# Patient Record
Sex: Female | Born: 1980 | Race: White | Hispanic: No | Marital: Married | Smoking: Never smoker
Health system: Southern US, Community
[De-identification: ages and names within clinical notes are randomized; demographics above are authoritative.]

## PROBLEM LIST (undated history)

## (undated) ENCOUNTER — Inpatient Hospital Stay (HOSPITAL_COMMUNITY): Payer: Self-pay

## (undated) DIAGNOSIS — I499 Cardiac arrhythmia, unspecified: Secondary | ICD-10-CM

## (undated) DIAGNOSIS — I4510 Unspecified right bundle-branch block: Secondary | ICD-10-CM

## (undated) DIAGNOSIS — F329 Major depressive disorder, single episode, unspecified: Secondary | ICD-10-CM

## (undated) DIAGNOSIS — F411 Generalized anxiety disorder: Secondary | ICD-10-CM

## (undated) DIAGNOSIS — I82409 Acute embolism and thrombosis of unspecified deep veins of unspecified lower extremity: Secondary | ICD-10-CM

## (undated) DIAGNOSIS — M5030 Other cervical disc degeneration, unspecified cervical region: Secondary | ICD-10-CM

## (undated) DIAGNOSIS — R9431 Abnormal electrocardiogram [ECG] [EKG]: Secondary | ICD-10-CM

## (undated) DIAGNOSIS — Z86718 Personal history of other venous thrombosis and embolism: Secondary | ICD-10-CM

## (undated) DIAGNOSIS — T4145XA Adverse effect of unspecified anesthetic, initial encounter: Secondary | ICD-10-CM

## (undated) DIAGNOSIS — Z973 Presence of spectacles and contact lenses: Secondary | ICD-10-CM

## (undated) DIAGNOSIS — L659 Nonscarring hair loss, unspecified: Secondary | ICD-10-CM

## (undated) DIAGNOSIS — N9985 Post endometrial ablation syndrome: Secondary | ICD-10-CM

## (undated) DIAGNOSIS — F419 Anxiety disorder, unspecified: Secondary | ICD-10-CM

## (undated) DIAGNOSIS — Z789 Other specified health status: Secondary | ICD-10-CM

## (undated) DIAGNOSIS — G4700 Insomnia, unspecified: Secondary | ICD-10-CM

## (undated) DIAGNOSIS — T8859XA Other complications of anesthesia, initial encounter: Secondary | ICD-10-CM

## (undated) DIAGNOSIS — F32A Depression, unspecified: Secondary | ICD-10-CM

## (undated) DIAGNOSIS — G245 Blepharospasm: Secondary | ICD-10-CM

## (undated) DIAGNOSIS — O321XX0 Maternal care for breech presentation, not applicable or unspecified: Secondary | ICD-10-CM

## (undated) DIAGNOSIS — G43719 Chronic migraine without aura, intractable, without status migrainosus: Secondary | ICD-10-CM

## (undated) HISTORY — PX: TONSILLECTOMY: SUR1361

## (undated) HISTORY — DX: Anxiety disorder, unspecified: F41.9

## (undated) HISTORY — PX: TUBAL LIGATION: SHX77

## (undated) HISTORY — DX: Insomnia, unspecified: G47.00

## (undated) HISTORY — PX: BACK SURGERY: SHX140

## (undated) HISTORY — DX: Acute embolism and thrombosis of unspecified deep veins of unspecified lower extremity: I82.409

## (undated) HISTORY — DX: Other cervical disc degeneration, unspecified cervical region: M50.30

---
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
## 2002-09-26 ENCOUNTER — Encounter: Payer: Self-pay | Admitting: Emergency Medicine

## 2002-09-26 ENCOUNTER — Inpatient Hospital Stay (HOSPITAL_COMMUNITY): Admission: AC | Admit: 2002-09-26 | Discharge: 2002-09-28 | Payer: Self-pay | Admitting: Emergency Medicine

## 2002-09-27 ENCOUNTER — Encounter: Payer: Self-pay | Admitting: General Surgery

## 2002-10-15 ENCOUNTER — Encounter: Admission: RE | Admit: 2002-10-15 | Discharge: 2002-10-15 | Payer: Self-pay | Admitting: Neurosurgery

## 2002-10-15 ENCOUNTER — Encounter: Payer: Self-pay | Admitting: Neurosurgery

## 2008-10-03 HISTORY — PX: TONSILLECTOMY AND ADENOIDECTOMY: SUR1326

## 2013-02-04 DIAGNOSIS — R61 Generalized hyperhidrosis: Secondary | ICD-10-CM | POA: Insufficient documentation

## 2013-02-04 HISTORY — DX: Generalized hyperhidrosis: R61

## 2013-03-25 DIAGNOSIS — R42 Dizziness and giddiness: Secondary | ICD-10-CM

## 2013-03-25 DIAGNOSIS — L659 Nonscarring hair loss, unspecified: Secondary | ICD-10-CM | POA: Insufficient documentation

## 2013-03-25 DIAGNOSIS — G43909 Migraine, unspecified, not intractable, without status migrainosus: Secondary | ICD-10-CM | POA: Insufficient documentation

## 2013-03-25 DIAGNOSIS — R7989 Other specified abnormal findings of blood chemistry: Secondary | ICD-10-CM

## 2013-03-25 DIAGNOSIS — F32A Depression, unspecified: Secondary | ICD-10-CM | POA: Insufficient documentation

## 2013-03-25 HISTORY — DX: Migraine, unspecified, not intractable, without status migrainosus: G43.909

## 2013-03-25 HISTORY — DX: Other specified abnormal findings of blood chemistry: R79.89

## 2013-03-25 HISTORY — DX: Dizziness and giddiness: R42

## 2013-03-25 HISTORY — DX: Nonscarring hair loss, unspecified: L65.9

## 2014-12-08 DIAGNOSIS — H02402 Unspecified ptosis of left eyelid: Secondary | ICD-10-CM

## 2014-12-08 HISTORY — DX: Unspecified ptosis of left eyelid: H02.402

## 2015-10-06 LAB — OB RESULTS CONSOLE HIV ANTIBODY (ROUTINE TESTING): HIV: NONREACTIVE

## 2015-10-06 LAB — OB RESULTS CONSOLE HEPATITIS B SURFACE ANTIGEN: HEP B S AG: NEGATIVE

## 2015-10-06 LAB — OB RESULTS CONSOLE GC/CHLAMYDIA
CHLAMYDIA, DNA PROBE: NEGATIVE
GC PROBE AMP, GENITAL: NEGATIVE

## 2015-10-06 LAB — OB RESULTS CONSOLE ANTIBODY SCREEN: Antibody Screen: NEGATIVE

## 2015-10-06 LAB — OB RESULTS CONSOLE RPR: RPR: NONREACTIVE

## 2015-11-19 LAB — OB RESULTS CONSOLE ABO/RH: RH TYPE: POSITIVE

## 2015-11-20 LAB — OB RESULTS CONSOLE RUBELLA ANTIBODY, IGM: RUBELLA: IMMUNE

## 2016-03-31 ENCOUNTER — Encounter (HOSPITAL_COMMUNITY): Payer: Self-pay

## 2016-03-31 ENCOUNTER — Inpatient Hospital Stay (HOSPITAL_COMMUNITY)
Admission: AD | Admit: 2016-03-31 | Discharge: 2016-04-01 | Disposition: A | Discharge status: Home / Self Care | Payer: Managed Care, Other (non HMO) | Source: Ambulatory Visit | Attending: Obstetrics and Gynecology | Admitting: Obstetrics and Gynecology

## 2016-03-31 DIAGNOSIS — O479 False labor, unspecified: Secondary | ICD-10-CM

## 2016-03-31 DIAGNOSIS — Z3A29 29 weeks gestation of pregnancy: Secondary | ICD-10-CM | POA: Insufficient documentation

## 2016-03-31 DIAGNOSIS — O4703 False labor before 37 completed weeks of gestation, third trimester: Secondary | ICD-10-CM | POA: Insufficient documentation

## 2016-03-31 HISTORY — DX: Other specified health status: Z78.9

## 2016-03-31 LAB — URINALYSIS, ROUTINE W REFLEX MICROSCOPIC
BILIRUBIN URINE: NEGATIVE
Glucose, UA: NEGATIVE mg/dL
HGB URINE DIPSTICK: NEGATIVE
Ketones, ur: NEGATIVE mg/dL
Leukocytes, UA: NEGATIVE
NITRITE: NEGATIVE
PROTEIN: NEGATIVE mg/dL
pH: 6 (ref 5.0–8.0)

## 2016-03-31 LAB — WET PREP, GENITAL
Clue Cells Wet Prep HPF POC: NONE SEEN
Sperm: NONE SEEN
TRICH WET PREP: NONE SEEN
YEAST WET PREP: NONE SEEN

## 2016-03-31 MED ORDER — LACTATED RINGERS IV BOLUS (SEPSIS)
1000.0000 mL | Freq: Once | INTRAVENOUS | Status: AC
  Administered 2016-03-31: 1000 mL via INTRAVENOUS

## 2016-03-31 MED ORDER — NIFEDIPINE 10 MG PO CAPS
10.0000 mg | ORAL_CAPSULE | ORAL | Status: AC
  Administered 2016-03-31 (×3): 10 mg via ORAL
  Filled 2016-03-31 (×3): qty 1

## 2016-03-31 NOTE — MAU Provider Note (Signed)
History     CSN: [id number]  Arrival date and time: 03/31/16 2157   First Provider Initiated Contact with Patient 03/31/16 2241      Chief Complaint  Patient presents with  . Contractions   HPI Comments: Deborah Petty is a 35 y.o. G2P0010 at [redacted]w[redacted]d who presents today with contractions. She states that they started around 2000. She states that they have been about 2-5 min apart at home. She rates her pain 6/10. She denies any VB or LOF. She states that about 4 weeks ago she had bleeding, but has not had any since. She states that her cervix was closed at that time. She states that the fetus has not been as active today. She has been feeling normal since being here. She reports that she has had intercourse in the last 24 hours.   Past Medical History  Diagnosis Date  . Medical history non-contributory     Past Surgical History  Procedure Laterality Date  . Tonsillectomy    . Back surgery      History reviewed. No pertinent family history.  Social History  Substance Use Topics  . Smoking status: Never Smoker   . Smokeless tobacco: Never Used  . Alcohol Use: Yes     Comment: socially    Allergies: No Known Allergies  Prescriptions prior to admission  Medication Sig Dispense Refill Last Dose  . docusate sodium (COLACE) 100 MG capsule Take 100 mg by mouth daily.   03/31/2016 at Unknown time  . Prenatal Vit-Fe Fumarate-FA (PRENATAL MULTIVITAMIN) TABS tablet Take 1 tablet by mouth at bedtime.   03/30/2016 at Unknown time    Review of Systems  Constitutional: Negative for fever and chills.  Gastrointestinal: Positive for nausea, abdominal pain and diarrhea. Negative for vomiting and constipation.  Genitourinary: Negative for dysuria, urgency and frequency.   Physical Exam   Blood pressure 113/64, pulse 96, temperature 98 F (36.7 C), temperature source Oral, resp. rate 18, height 5\' 6"  (1.676 m), weight 73.936 kg (163 lb), SpO2 99 %.  Physical Exam  Nursing note and vitals  reviewed. Constitutional: She is oriented to person, place, and time. She appears well-developed and well-nourished. No distress.  HENT:  Head: Normocephalic.  Cardiovascular: Normal rate.   Respiratory: Effort normal.  GI: Soft. There is no tenderness.  Genitourinary:   External: no lesion Vagina: small amount of white discharge Cervix: pink, smooth, closed/thick Uterus: AGA   Neurological: She is alert and oriented to person, place, and time.  Skin: Skin is warm and dry.  Psychiatric: She has a normal mood and affect.   FHT: 125, moderate with 15x15 accels, variable deceleration.  Toco: about q 5 min contractions   Results for orders placed or performed during the hospital encounter of 03/31/16 (from the past 24 hour(s))  Urinalysis, Routine w reflex microscopic (not at [facility])     Status: Abnormal   Collection Time: 03/31/16 10:02 PM  Result Value Ref Range   Color, Urine YELLOW YELLOW   APPearance CLEAR CLEAR   Specific Gravity, Urine <1.005 (L) 1.005 - 1.030   pH 6.0 5.0 - 8.0   Glucose, UA NEGATIVE NEGATIVE mg/dL   Hgb urine dipstick NEGATIVE NEGATIVE   Bilirubin Urine NEGATIVE NEGATIVE   Ketones, ur NEGATIVE NEGATIVE mg/dL   Protein, ur NEGATIVE NEGATIVE mg/dL   Nitrite NEGATIVE NEGATIVE   Leukocytes, UA NEGATIVE NEGATIVE  Wet prep, genital     Status: Abnormal   Collection Time: 03/31/16 10:52 PM  Result Value  Ref Range   Yeast Wet Prep HPF POC NONE SEEN NONE SEEN   Trich, Wet Prep NONE SEEN NONE SEEN   Clue Cells Wet Prep HPF POC NONE SEEN NONE SEEN   WBC, Wet Prep HPF POC FEW (A) NONE SEEN   Sperm NONE SEEN   ; MAU Course  Procedures  MDM 2359: Patient has had 1L  Of LR and 3 doses of procardia. Contractions have spaced out. About every 10 mins with some UI. Patient reports that pain has decreased. 0004: Cervix unchanged.  0019: D/W Dr. Ronita Hipps, ok for DC home.   Assessment and Plan   1. Braxton Hicks contractions   2. [redacted] weeks gestation of pregnancy     DC home Comfort measures reviewed  3rd Trimester precautions  PTL precautions  Fetal kick counts RX: none  Return to MAU as needed FU with OB as planned  Follow-up Information    Follow up with Lovenia Kim, MD.   Specialty:  Obstetrics and Gynecology   Why:  As scheduled   Contact information:   [place] [address] [phone]         Mathis Bud 03/31/2016, 10:43 PM

## 2016-03-31 NOTE — Progress Notes (Signed)
States to have MAU provider assess patient.

## 2016-03-31 NOTE — MAU Note (Signed)
Pt c/o lower abdominal cramping and back pain that started around 8pm. States in the last hour has had 13 contractions. Denies vag bleeding or LOF. +FM. Has increase in frequency with urination but denies pain or burning.

## 2016-04-01 NOTE — Discharge Instructions (Signed)

## 2016-04-02 ENCOUNTER — Encounter (HOSPITAL_COMMUNITY): Payer: Self-pay | Admitting: *Deleted

## 2016-04-02 ENCOUNTER — Inpatient Hospital Stay (HOSPITAL_COMMUNITY)
Admission: AD | Admit: 2016-04-02 | Discharge: 2016-04-02 | Disposition: A | Discharge status: Home / Self Care | Payer: Managed Care, Other (non HMO) | Source: Ambulatory Visit | Attending: Obstetrics and Gynecology | Admitting: Obstetrics and Gynecology

## 2016-04-02 DIAGNOSIS — Z3A30 30 weeks gestation of pregnancy: Secondary | ICD-10-CM | POA: Insufficient documentation

## 2016-04-02 MED ORDER — BETAMETHASONE SOD PHOS & ACET 6 (3-3) MG/ML IJ SUSP
12.0000 mg | Freq: Once | INTRAMUSCULAR | Status: AC
  Administered 2016-04-02: 12 mg via INTRAMUSCULAR
  Filled 2016-04-02: qty 2

## 2016-04-02 NOTE — MAU Note (Signed)
Here for betamethasone. Was here on Thursday with cramping.  No c/o today  Reviewed hydration and activity

## 2016-04-03 ENCOUNTER — Inpatient Hospital Stay (HOSPITAL_COMMUNITY)
Admission: AD | Admit: 2016-04-03 | Discharge: 2016-04-03 | Disposition: A | Discharge status: Home / Self Care | Payer: Managed Care, Other (non HMO) | Source: Ambulatory Visit | Attending: Obstetrics and Gynecology | Admitting: Obstetrics and Gynecology

## 2016-04-03 DIAGNOSIS — Z3A30 30 weeks gestation of pregnancy: Secondary | ICD-10-CM | POA: Diagnosis not present

## 2016-04-03 MED ORDER — BETAMETHASONE SOD PHOS & ACET 6 (3-3) MG/ML IJ SUSP
12.0000 mg | Freq: Once | INTRAMUSCULAR | Status: AC
  Administered 2016-04-03: 12 mg via INTRAMUSCULAR
  Filled 2016-04-03: qty 2

## 2016-04-03 NOTE — MAU Note (Signed)
Bad indigestion last night.  tums are not working.  Safe med list given to pt and reviewed.  States had contractions during the night, nothing regular, did take one of her pills as instructed, no longer contracting.

## 2016-04-04 ENCOUNTER — Encounter (HOSPITAL_COMMUNITY): Payer: Self-pay

## 2016-04-04 ENCOUNTER — Encounter (HOSPITAL_COMMUNITY): Payer: Self-pay | Admitting: *Deleted

## 2016-04-04 LAB — GC/CHLAMYDIA PROBE AMP (~~LOC~~) NOT AT ARMC
CHLAMYDIA, DNA PROBE: NEGATIVE
NEISSERIA GONORRHEA: NEGATIVE

## 2016-05-23 ENCOUNTER — Inpatient Hospital Stay (HOSPITAL_COMMUNITY)
Admission: AD | Admit: 2016-05-23 | Discharge: 2016-05-23 | Disposition: A | Discharge status: Home / Self Care | Payer: Managed Care, Other (non HMO) | Source: Ambulatory Visit | Attending: Obstetrics | Admitting: Obstetrics

## 2016-05-23 ENCOUNTER — Encounter (HOSPITAL_COMMUNITY): Payer: Self-pay | Admitting: *Deleted

## 2016-05-23 DIAGNOSIS — Z3A37 37 weeks gestation of pregnancy: Secondary | ICD-10-CM | POA: Insufficient documentation

## 2016-05-23 DIAGNOSIS — O471 False labor at or after 37 completed weeks of gestation: Secondary | ICD-10-CM | POA: Diagnosis present

## 2016-05-23 NOTE — MAU Note (Signed)
States she has been contracting all day. Saw Dr. Ronita Hipps in the office earlier. Was not dilating. States he advised her to stay in [place] for awhile and see what happens. States he told her she could eat lightly. Pt states baby is breech.

## 2016-05-23 NOTE — Discharge Instructions (Signed)
Keep your scheduled appt for prenatal care tomorrow. Call Dr. Ronita Hipps with further concerns.

## 2016-05-26 ENCOUNTER — Other Ambulatory Visit: Payer: Self-pay | Admitting: Obstetrics and Gynecology

## 2016-05-27 ENCOUNTER — Telehealth (HOSPITAL_COMMUNITY): Payer: Self-pay | Admitting: *Deleted

## 2016-05-27 ENCOUNTER — Encounter (HOSPITAL_COMMUNITY): Payer: Self-pay

## 2016-05-27 NOTE — Telephone Encounter (Signed)
Preadmission screen  

## 2016-05-30 ENCOUNTER — Encounter (HOSPITAL_COMMUNITY): Payer: Self-pay | Admitting: Anesthesiology

## 2016-05-30 ENCOUNTER — Encounter (HOSPITAL_COMMUNITY)
Admission: RE | Admit: 2016-05-30 | Discharge: 2016-05-30 | Disposition: A | Discharge status: Home / Self Care | Payer: Managed Care, Other (non HMO) | Source: Ambulatory Visit | Attending: Obstetrics and Gynecology | Admitting: Obstetrics and Gynecology

## 2016-05-30 LAB — TYPE AND SCREEN
ABO/RH(D): A POS
ANTIBODY SCREEN: NEGATIVE

## 2016-05-30 LAB — CBC
HEMATOCRIT: 38.4 % (ref 36.0–46.0)
Hemoglobin: 13.5 g/dL (ref 12.0–15.0)
MCH: 29.5 pg (ref 26.0–34.0)
MCHC: 35.2 g/dL (ref 30.0–36.0)
MCV: 84 fL (ref 78.0–100.0)
PLATELETS: 135 10*3/uL — AB (ref 150–400)
RBC: 4.57 MIL/uL (ref 3.87–5.11)
RDW: 13.7 % (ref 11.5–15.5)
WBC: 12 10*3/uL — AB (ref 4.0–10.5)

## 2016-05-30 LAB — ABO/RH: ABO/RH(D): A POS

## 2016-05-30 NOTE — Patient Instructions (Signed)
20 Nil Florek  05/30/2016   Your procedure is scheduled on:  05/31/2016  Enter through the Main Entrance of [facility] at 3:45 PM.  Pick up the phone at the desk and dial [phone].   Call this number if you have problems the morning of surgery: [phone]   Remember:   Do not eat food:After Midnight.  Do not drink clear liquids: 4 Hours before arrival.  Take these medicines the morning of surgery with A SIP OF WATER: na   Do not wear jewelry, make-up or nail polish.  Do not wear lotions, powders, or perfumes. Do not wear deodorant.  Do not shave 48 hours prior to surgery.  Do not bring valuables to the hospital.  [facility] is not   responsible for any belongings or valuables brought to the hospital.  Contacts, dentures or bridgework may not be worn into surgery.  Leave suitcase in the car. After surgery it may be brought to your room.  For patients admitted to the hospital, checkout time is 11:00 AM the day of              discharge.   Patients discharged the day of surgery will not be allowed to drive             home.  Name and phone number of your driver: na  Special Instructions:   N/A   Please read over the following fact sheets that you were given:   Surgical Site Infection Prevention

## 2016-05-31 ENCOUNTER — Inpatient Hospital Stay (HOSPITAL_COMMUNITY)
Admission: RE | Admit: 2016-05-31 | Payer: Managed Care, Other (non HMO) | Source: Ambulatory Visit | Admitting: Obstetrics and Gynecology

## 2016-05-31 LAB — RPR: RPR Ser Ql: NONREACTIVE

## 2016-05-31 SURGERY — Surgical Case
Anesthesia: Spinal

## 2016-05-31 NOTE — Progress Notes (Signed)
Called by L&D RN on 8/28 at 1730 and informed of cancellation of patient's cesarean section by Dr. Darron Doom due to inadequate documentation to support patient's EDD. Patient called office approximately two weeks inquiring about her incorrect LMP. LMP was adjusted per patient history. Newly reported LMP is agreement with multiple ultrasound documented EDDs. Pregnancy complicated by PTL , breech, and cervical dilatation. Patient lives one hour away from hospital. Patient expresses concerns about possible complications including cord prolapse. Explained to patient that this decision was made by Dr. Kennon Rounds and private MD has no recourse. Office will send all OB ultrasounds to Dr. Kennon Rounds for review.

## 2016-06-03 ENCOUNTER — Inpatient Hospital Stay (HOSPITAL_COMMUNITY)
Admission: AD | Admit: 2016-06-03 | Discharge: 2016-06-03 | Disposition: A | Discharge status: Home / Self Care | Payer: Managed Care, Other (non HMO) | Source: Ambulatory Visit | Attending: Obstetrics and Gynecology | Admitting: Obstetrics and Gynecology

## 2016-06-03 ENCOUNTER — Encounter (HOSPITAL_COMMUNITY): Payer: Managed Care, Other (non HMO)

## 2016-06-03 ENCOUNTER — Encounter (HOSPITAL_COMMUNITY): Payer: Self-pay | Admitting: *Deleted

## 2016-06-03 DIAGNOSIS — Z3A40 40 weeks gestation of pregnancy: Secondary | ICD-10-CM | POA: Diagnosis not present

## 2016-06-03 DIAGNOSIS — Z3493 Encounter for supervision of normal pregnancy, unspecified, third trimester: Secondary | ICD-10-CM | POA: Insufficient documentation

## 2016-06-03 NOTE — Discharge Instructions (Signed)
Fetal Movement Counts  Patient Name: __________________________________________________ Patient Due Date: ____________________  Performing a fetal movement count is highly recommended in high-risk pregnancies, but it is good for every pregnant woman to do. Your health care provider may ask you to start counting fetal movements at 28 weeks of the pregnancy. Fetal movements often increase:  · After eating a full meal.  · After physical activity.  · After eating or drinking something sweet or cold.  · At rest.  Pay attention to when you feel the baby is most active. This will help you notice a pattern of your baby's sleep and wake cycles and what factors contribute to an increase in fetal movement. It is important to perform a fetal movement count at the same time each day when your baby is normally most active.   HOW TO COUNT FETAL MOVEMENTS  1. Find a quiet and comfortable area to sit or lie down on your left side. Lying on your left side provides the best blood and oxygen circulation to your baby.  2. Write down the day and time on a sheet of paper or in a journal.  3. Start counting kicks, flutters, swishes, rolls, or jabs in a 2-hour period. You should feel at least 10 movements within 2 hours.  4. If you do not feel 10 movements in 2 hours, wait 2-3 hours and count again. Look for a change in the pattern or not enough counts in 2 hours.  SEEK MEDICAL CARE IF:  · You feel less than 10 counts in 2 hours, tried twice.  · There is no movement in over an hour.  · The pattern is changing or taking longer each day to reach 10 counts in 2 hours.  · You feel the baby is not moving as he or she usually does.  Date: ____________ Movements: ____________ Start time: ____________ Finish time: ____________   Date: ____________ Movements: ____________ Start time: ____________ Finish time: ____________  Date: ____________ Movements: ____________ Start time: ____________ Finish time: ____________  Date: ____________ Movements:  ____________ Start time: ____________ Finish time: ____________  Date: ____________ Movements: ____________ Start time: ____________ Finish time: ____________  Date: ____________ Movements: ____________ Start time: ____________ Finish time: ____________  Date: ____________ Movements: ____________ Start time: ____________ Finish time: ____________  Date: ____________ Movements: ____________ Start time: ____________ Finish time: ____________   Date: ____________ Movements: ____________ Start time: ____________ Finish time: ____________  Date: ____________ Movements: ____________ Start time: ____________ Finish time: ____________  Date: ____________ Movements: ____________ Start time: ____________ Finish time: ____________  Date: ____________ Movements: ____________ Start time: ____________ Finish time: ____________  Date: ____________ Movements: ____________ Start time: ____________ Finish time: ____________  Date: ____________ Movements: ____________ Start time: ____________ Finish time: ____________  Date: ____________ Movements: ____________ Start time: ____________ Finish time: ____________   Date: ____________ Movements: ____________ Start time: ____________ Finish time: ____________  Date: ____________ Movements: ____________ Start time: ____________ Finish time: ____________  Date: ____________ Movements: ____________ Start time: ____________ Finish time: ____________  Date: ____________ Movements: ____________ Start time: ____________ Finish time: ____________  Date: ____________ Movements: ____________ Start time: ____________ Finish time: ____________  Date: ____________ Movements: ____________ Start time: ____________ Finish time: ____________  Date: ____________ Movements: ____________ Start time: ____________ Finish time: ____________   Date: ____________ Movements: ____________ Start time: ____________ Finish time: ____________  Date: ____________ Movements: ____________ Start time: ____________ Finish  time: ____________  Date: ____________ Movements: ____________ Start time: ____________ Finish time: ____________  Date: ____________ Movements: ____________ Start time:   ____________ Finish time: ____________  Date: ____________ Movements: ____________ Start time: ____________ Finish time: ____________  Date: ____________ Movements: ____________ Start time: ____________ Finish time: ____________  Date: ____________ Movements: ____________ Start time: ____________ Finish time: ____________   Date: ____________ Movements: ____________ Start time: ____________ Finish time: ____________  Date: ____________ Movements: ____________ Start time: ____________ Finish time: ____________  Date: ____________ Movements: ____________ Start time: ____________ Finish time: ____________  Date: ____________ Movements: ____________ Start time: ____________ Finish time: ____________  Date: ____________ Movements: ____________ Start time: ____________ Finish time: ____________  Date: ____________ Movements: ____________ Start time: ____________ Finish time: ____________  Date: ____________ Movements: ____________ Start time: ____________ Finish time: ____________   Date: ____________ Movements: ____________ Start time: ____________ Finish time: ____________  Date: ____________ Movements: ____________ Start time: ____________ Finish time: ____________  Date: ____________ Movements: ____________ Start time: ____________ Finish time: ____________  Date: ____________ Movements: ____________ Start time: ____________ Finish time: ____________  Date: ____________ Movements: ____________ Start time: ____________ Finish time: ____________  Date: ____________ Movements: ____________ Start time: ____________ Finish time: ____________  Date: ____________ Movements: ____________ Start time: ____________ Finish time: ____________   Date: ____________ Movements: ____________ Start time: ____________ Finish time: ____________  Date: ____________  Movements: ____________ Start time: ____________ Finish time: ____________  Date: ____________ Movements: ____________ Start time: ____________ Finish time: ____________  Date: ____________ Movements: ____________ Start time: ____________ Finish time: ____________  Date: ____________ Movements: ____________ Start time: ____________ Finish time: ____________  Date: ____________ Movements: ____________ Start time: ____________ Finish time: ____________  Date: ____________ Movements: ____________ Start time: ____________ Finish time: ____________   Date: ____________ Movements: ____________ Start time: ____________ Finish time: ____________  Date: ____________ Movements: ____________ Start time: ____________ Finish time: ____________  Date: ____________ Movements: ____________ Start time: ____________ Finish time: ____________  Date: ____________ Movements: ____________ Start time: ____________ Finish time: ____________  Date: ____________ Movements: ____________ Start time: ____________ Finish time: ____________  Date: ____________ Movements: ____________ Start time: ____________ Finish time: ____________     This information is not intended to replace advice given to you by your health care provider. Make sure you discuss any questions you have with your health care provider.     Document Released: 10/19/2006 Document Revised: 10/10/2014 Document Reviewed: 07/16/2012  Elsevier Interactive Patient Education ©2016 [employer]

## 2016-06-03 NOTE — MAU Note (Signed)
Pt states she felt like she needed to have a BM about 1100 this morning with lots of pelvic pressure.  States she hasn't felt much movement today.  Breech presentation.  Scheduled for C/S on Tues.  Denies vaginal bleeding, ROM, or abnormal discharge.

## 2016-06-07 ENCOUNTER — Inpatient Hospital Stay (HOSPITAL_COMMUNITY): Payer: Managed Care, Other (non HMO) | Admitting: Anesthesiology

## 2016-06-07 ENCOUNTER — Encounter (HOSPITAL_COMMUNITY)
Admission: RE | Disposition: A | Discharge status: Home / Self Care | Payer: Self-pay | Source: Ambulatory Visit | Attending: Obstetrics and Gynecology

## 2016-06-07 ENCOUNTER — Encounter (HOSPITAL_COMMUNITY): Payer: Self-pay

## 2016-06-07 ENCOUNTER — Inpatient Hospital Stay (HOSPITAL_COMMUNITY)
Admission: RE | Admit: 2016-06-07 | Discharge: 2016-06-10 | DRG: 765 | Disposition: A | Discharge status: Home / Self Care | Payer: Managed Care, Other (non HMO) | Source: Ambulatory Visit | Attending: Obstetrics and Gynecology | Admitting: Obstetrics and Gynecology

## 2016-06-07 DIAGNOSIS — Z8249 Family history of ischemic heart disease and other diseases of the circulatory system: Secondary | ICD-10-CM

## 2016-06-07 DIAGNOSIS — D62 Acute posthemorrhagic anemia: Secondary | ICD-10-CM | POA: Diagnosis not present

## 2016-06-07 DIAGNOSIS — O321XX0 Maternal care for breech presentation, not applicable or unspecified: Principal | ICD-10-CM | POA: Diagnosis present

## 2016-06-07 DIAGNOSIS — Z3A40 40 weeks gestation of pregnancy: Secondary | ICD-10-CM | POA: Diagnosis not present

## 2016-06-07 DIAGNOSIS — Z833 Family history of diabetes mellitus: Secondary | ICD-10-CM

## 2016-06-07 DIAGNOSIS — O9081 Anemia of the puerperium: Secondary | ICD-10-CM | POA: Diagnosis not present

## 2016-06-07 HISTORY — DX: Maternal care for breech presentation, not applicable or unspecified: O32.1XX0

## 2016-06-07 LAB — CBC
HCT: 40.1 % (ref 36.0–46.0)
HEMOGLOBIN: 14.4 g/dL (ref 12.0–15.0)
MCH: 30.4 pg (ref 26.0–34.0)
MCHC: 35.9 g/dL (ref 30.0–36.0)
MCV: 84.6 fL (ref 78.0–100.0)
Platelets: 157 10*3/uL (ref 150–400)
RBC: 4.74 MIL/uL (ref 3.87–5.11)
RDW: 13.6 % (ref 11.5–15.5)
WBC: 12.7 10*3/uL — AB (ref 4.0–10.5)

## 2016-06-07 LAB — TYPE AND SCREEN
ABO/RH(D): A POS
ANTIBODY SCREEN: NEGATIVE

## 2016-06-07 LAB — URINALYSIS, ROUTINE W REFLEX MICROSCOPIC
GLUCOSE, UA: NEGATIVE mg/dL
KETONES UR: NEGATIVE mg/dL
Leukocytes, UA: NEGATIVE
Nitrite: NEGATIVE
PH: 6 (ref 5.0–8.0)
Protein, ur: 30 mg/dL — AB

## 2016-06-07 LAB — URINE MICROSCOPIC-ADD ON

## 2016-06-07 SURGERY — Surgical Case
Anesthesia: Spinal

## 2016-06-07 MED ORDER — BUPIVACAINE HCL (PF) 0.25 % IJ SOLN
INTRAMUSCULAR | Status: AC
  Filled 2016-06-07: qty 10

## 2016-06-07 MED ORDER — LACTATED RINGERS IV SOLN
INTRAVENOUS | Status: DC
  Administered 2016-06-07: 125 mL/h via INTRAVENOUS

## 2016-06-07 MED ORDER — ONDANSETRON HCL 4 MG/2ML IJ SOLN
INTRAMUSCULAR | Status: AC
  Filled 2016-06-07: qty 2

## 2016-06-07 MED ORDER — EPHEDRINE SULFATE 50 MG/ML IJ SOLN
INTRAMUSCULAR | Status: DC | PRN
  Administered 2016-06-07 (×2): 10 mg via INTRAVENOUS

## 2016-06-07 MED ORDER — SENNOSIDES-DOCUSATE SODIUM 8.6-50 MG PO TABS
2.0000 | ORAL_TABLET | ORAL | Status: DC
  Administered 2016-06-08 – 2016-06-10 (×3): 2 via ORAL
  Filled 2016-06-07 (×5): qty 2

## 2016-06-07 MED ORDER — SIMETHICONE 80 MG PO CHEW: 80.0000 mg | CHEWABLE_TABLET | ORAL | Status: DC | PRN

## 2016-06-07 MED ORDER — SODIUM CHLORIDE 0.9% FLUSH: 3.0000 mL | INTRAVENOUS | Status: DC | PRN

## 2016-06-07 MED ORDER — PRENATAL MULTIVITAMIN CH
1.0000 | ORAL_TABLET | Freq: Every day | ORAL | Status: DC
  Administered 2016-06-08 – 2016-06-10 (×3): 1 via ORAL
  Filled 2016-06-07 (×3): qty 1

## 2016-06-07 MED ORDER — MENTHOL 3 MG MT LOZG: 1.0000 | LOZENGE | OROMUCOSAL | Status: DC | PRN

## 2016-06-07 MED ORDER — OXYTOCIN 10 UNIT/ML IJ SOLN
INTRAMUSCULAR | Status: AC
  Filled 2016-06-07: qty 4

## 2016-06-07 MED ORDER — SCOPOLAMINE 1 MG/3DAYS TD PT72
1.0000 | MEDICATED_PATCH | Freq: Once | TRANSDERMAL | Status: DC
Start: 2016-06-07 — End: 2016-06-07
  Administered 2016-06-07: 1.5 mg via TRANSDERMAL

## 2016-06-07 MED ORDER — PHENYLEPHRINE 8 MG IN D5W 100 ML (0.08MG/ML) PREMIX OPTIME
INJECTION | INTRAVENOUS | Status: AC
  Filled 2016-06-07: qty 100

## 2016-06-07 MED ORDER — SCOPOLAMINE 1 MG/3DAYS TD PT72
MEDICATED_PATCH | TRANSDERMAL | Status: AC
  Administered 2016-06-07: 1.5 mg via TRANSDERMAL
  Filled 2016-06-07: qty 1

## 2016-06-07 MED ORDER — METHYLERGONOVINE MALEATE 0.2 MG/ML IJ SOLN
0.2000 mg | INTRAMUSCULAR | Status: DC | PRN
Start: 2016-06-07 — End: 2016-06-10

## 2016-06-07 MED ORDER — SIMETHICONE 80 MG PO CHEW
80.0000 mg | CHEWABLE_TABLET | ORAL | Status: DC
  Administered 2016-06-08 – 2016-06-10 (×3): 80 mg via ORAL
  Filled 2016-06-07 (×3): qty 1

## 2016-06-07 MED ORDER — LACTATED RINGERS IV SOLN
INTRAVENOUS | Status: DC
  Administered 2016-06-08: 03:00:00 via INTRAVENOUS

## 2016-06-07 MED ORDER — ONDANSETRON HCL 4 MG/2ML IJ SOLN
4.0000 mg | Freq: Once | INTRAMUSCULAR | Status: AC | PRN
  Administered 2016-06-07: 4 mg via INTRAVENOUS

## 2016-06-07 MED ORDER — FENTANYL CITRATE (PF) 100 MCG/2ML IJ SOLN
INTRAMUSCULAR | Status: AC
  Filled 2016-06-07: qty 2

## 2016-06-07 MED ORDER — DIPHENHYDRAMINE HCL 50 MG/ML IJ SOLN: 12.5000 mg | INTRAMUSCULAR | Status: DC | PRN

## 2016-06-07 MED ORDER — DIBUCAINE 1 % RE OINT: 1.0000 "application " | TOPICAL_OINTMENT | RECTAL | Status: DC | PRN

## 2016-06-07 MED ORDER — MORPHINE SULFATE (PF) 0.5 MG/ML IJ SOLN
INTRAMUSCULAR | Status: DC | PRN
Start: 2016-06-07 — End: 2016-06-07
  Administered 2016-06-07: .2 mg via INTRATHECAL

## 2016-06-07 MED ORDER — TETANUS-DIPHTH-ACELL PERTUSSIS 5-2.5-18.5 LF-MCG/0.5 IM SUSP: 0.5000 mL | Freq: Once | INTRAMUSCULAR | Status: DC

## 2016-06-07 MED ORDER — ZOLPIDEM TARTRATE 5 MG PO TABS: 5.0000 mg | ORAL_TABLET | Freq: Every evening | ORAL | Status: DC | PRN

## 2016-06-07 MED ORDER — NALBUPHINE HCL 10 MG/ML IJ SOLN
5.0000 mg | INTRAMUSCULAR | Status: DC | PRN
  Filled 2016-06-07: qty 1

## 2016-06-07 MED ORDER — OXYTOCIN 10 UNIT/ML IJ SOLN
INTRAMUSCULAR | Status: DC | PRN
  Administered 2016-06-07: 40 [IU] via INTRAVENOUS

## 2016-06-07 MED ORDER — ONDANSETRON HCL 4 MG/2ML IJ SOLN
INTRAMUSCULAR | Status: DC | PRN
  Administered 2016-06-07: 4 mg via INTRAVENOUS

## 2016-06-07 MED ORDER — WITCH HAZEL-GLYCERIN EX PADS: 1.0000 "application " | MEDICATED_PAD | CUTANEOUS | Status: DC | PRN

## 2016-06-07 MED ORDER — METHYLERGONOVINE MALEATE 0.2 MG PO TABS
0.2000 mg | ORAL_TABLET | ORAL | Status: DC | PRN
Start: 2016-06-07 — End: 2016-06-10

## 2016-06-07 MED ORDER — NALBUPHINE HCL 10 MG/ML IJ SOLN
5.0000 mg | INTRAMUSCULAR | Status: DC | PRN
Start: 2016-06-07 — End: 2016-06-10
  Administered 2016-06-07 – 2016-06-08 (×3): 5 mg via SUBCUTANEOUS
  Filled 2016-06-07: qty 1

## 2016-06-07 MED ORDER — BUPIVACAINE IN DEXTROSE 0.75-8.25 % IT SOLN
INTRATHECAL | Status: DC | PRN
  Administered 2016-06-07: 1.7 mg via INTRATHECAL

## 2016-06-07 MED ORDER — EPHEDRINE 5 MG/ML INJ
INTRAVENOUS | Status: AC
  Filled 2016-06-07: qty 10

## 2016-06-07 MED ORDER — IBUPROFEN 600 MG PO TABS
600.0000 mg | ORAL_TABLET | Freq: Four times a day (QID) | ORAL | Status: DC
  Administered 2016-06-08 – 2016-06-10 (×9): 600 mg via ORAL
  Filled 2016-06-07 (×10): qty 1

## 2016-06-07 MED ORDER — NALBUPHINE HCL 10 MG/ML IJ SOLN: 5.0000 mg | Freq: Once | INTRAMUSCULAR | Status: AC | PRN

## 2016-06-07 MED ORDER — SODIUM CHLORIDE 0.9 % IR SOLN
Status: DC | PRN
  Administered 2016-06-07: 1

## 2016-06-07 MED ORDER — ONDANSETRON HCL 4 MG/2ML IJ SOLN
4.0000 mg | Freq: Three times a day (TID) | INTRAMUSCULAR | Status: DC | PRN
  Administered 2016-06-08 – 2016-06-10 (×4): 4 mg via INTRAVENOUS
  Filled 2016-06-07 (×4): qty 2

## 2016-06-07 MED ORDER — OXYTOCIN 40 UNITS IN LACTATED RINGERS INFUSION - SIMPLE MED: 2.5000 [IU]/h | INTRAVENOUS | Status: AC

## 2016-06-07 MED ORDER — BUPIVACAINE HCL (PF) 0.25 % IJ SOLN
INTRAMUSCULAR | Status: DC | PRN
  Administered 2016-06-07: 20 mL

## 2016-06-07 MED ORDER — FENTANYL CITRATE (PF) 100 MCG/2ML IJ SOLN
INTRAMUSCULAR | Status: DC | PRN
  Administered 2016-06-07: 10 ug via INTRATHECAL

## 2016-06-07 MED ORDER — DIPHENHYDRAMINE HCL 25 MG PO CAPS
25.0000 mg | ORAL_CAPSULE | Freq: Four times a day (QID) | ORAL | Status: DC | PRN
  Administered 2016-06-08: 25 mg via ORAL
  Filled 2016-06-07 (×2): qty 1

## 2016-06-07 MED ORDER — KETOROLAC TROMETHAMINE 30 MG/ML IJ SOLN
30.0000 mg | Freq: Four times a day (QID) | INTRAMUSCULAR | Status: AC | PRN
Start: 2016-06-07 — End: 2016-06-08
  Administered 2016-06-07 – 2016-06-08 (×2): 30 mg via INTRAVENOUS
  Filled 2016-06-07 (×2): qty 1

## 2016-06-07 MED ORDER — MEPERIDINE HCL 25 MG/ML IJ SOLN: 6.2500 mg | INTRAMUSCULAR | Status: DC | PRN

## 2016-06-07 MED ORDER — LACTATED RINGERS IV SOLN: INTRAVENOUS | Status: DC

## 2016-06-07 MED ORDER — SIMETHICONE 80 MG PO CHEW
80.0000 mg | CHEWABLE_TABLET | Freq: Three times a day (TID) | ORAL | Status: DC
  Administered 2016-06-07 – 2016-06-10 (×8): 80 mg via ORAL
  Filled 2016-06-07 (×9): qty 1

## 2016-06-07 MED ORDER — OXYCODONE-ACETAMINOPHEN 5-325 MG PO TABS
1.0000 | ORAL_TABLET | ORAL | Status: DC | PRN
  Administered 2016-06-09: 1 via ORAL
  Filled 2016-06-07: qty 1

## 2016-06-07 MED ORDER — COCONUT OIL OIL
1.0000 "application " | TOPICAL_OIL | Status: DC | PRN
  Filled 2016-06-07: qty 120

## 2016-06-07 MED ORDER — LACTATED RINGERS IV SOLN: Freq: Once | INTRAVENOUS | Status: AC

## 2016-06-07 MED ORDER — NALOXONE HCL 0.4 MG/ML IJ SOLN: 0.4000 mg | INTRAMUSCULAR | Status: DC | PRN

## 2016-06-07 MED ORDER — DIPHENHYDRAMINE HCL 25 MG PO CAPS
25.0000 mg | ORAL_CAPSULE | ORAL | Status: DC | PRN
  Administered 2016-06-07: 25 mg via ORAL
  Filled 2016-06-07 (×2): qty 1

## 2016-06-07 MED ORDER — LACTATED RINGERS IV SOLN
INTRAVENOUS | Status: DC | PRN
Start: 2016-06-07 — End: 2016-06-07
  Administered 2016-06-07 (×3): via INTRAVENOUS

## 2016-06-07 MED ORDER — LACTATED RINGERS IV SOLN
Freq: Once | INTRAVENOUS | Status: AC
  Administered 2016-06-07: 1000 mL/h via INTRAVENOUS

## 2016-06-07 MED ORDER — NALOXONE HCL 2 MG/2ML IJ SOSY
1.0000 ug/kg/h | PREFILLED_SYRINGE | INTRAVENOUS | Status: DC | PRN
  Filled 2016-06-07: qty 2

## 2016-06-07 MED ORDER — MORPHINE SULFATE-NACL 0.5-0.9 MG/ML-% IV SOSY
PREFILLED_SYRINGE | INTRAVENOUS | Status: AC
  Filled 2016-06-07: qty 1

## 2016-06-07 MED ORDER — KETOROLAC TROMETHAMINE 30 MG/ML IJ SOLN
INTRAMUSCULAR | Status: AC
  Filled 2016-06-07: qty 1

## 2016-06-07 MED ORDER — FENTANYL CITRATE (PF) 100 MCG/2ML IJ SOLN: 25.0000 ug | INTRAMUSCULAR | Status: DC | PRN

## 2016-06-07 MED ORDER — PHENYLEPHRINE 8 MG IN D5W 100 ML (0.08MG/ML) PREMIX OPTIME
INJECTION | INTRAVENOUS | Status: DC | PRN
  Administered 2016-06-07 (×3): 60 ug/min via INTRAVENOUS

## 2016-06-07 MED ORDER — NALBUPHINE HCL 10 MG/ML IJ SOLN
INTRAMUSCULAR | Status: AC
  Filled 2016-06-07: qty 1

## 2016-06-07 MED ORDER — OXYCODONE-ACETAMINOPHEN 5-325 MG PO TABS: 2.0000 | ORAL_TABLET | ORAL | Status: DC | PRN

## 2016-06-07 MED ORDER — SCOPOLAMINE 1 MG/3DAYS TD PT72: 1.0000 | MEDICATED_PATCH | Freq: Once | TRANSDERMAL | Status: DC

## 2016-06-07 MED ORDER — ACETAMINOPHEN 325 MG PO TABS
650.0000 mg | ORAL_TABLET | ORAL | Status: DC | PRN
  Administered 2016-06-08 – 2016-06-09 (×3): 650 mg via ORAL
  Filled 2016-06-07 (×3): qty 2

## 2016-06-07 MED ORDER — CEFAZOLIN SODIUM-DEXTROSE 2-3 GM-% IV SOLR
INTRAVENOUS | Status: DC | PRN
  Administered 2016-06-07: 2 g via INTRAVENOUS

## 2016-06-07 MED ORDER — ACETAMINOPHEN 500 MG PO TABS
1000.0000 mg | ORAL_TABLET | Freq: Four times a day (QID) | ORAL | Status: AC
  Administered 2016-06-07 – 2016-06-08 (×3): 1000 mg via ORAL
  Filled 2016-06-07 (×4): qty 2

## 2016-06-07 MED ORDER — NALBUPHINE HCL 10 MG/ML IJ SOLN
5.0000 mg | Freq: Once | INTRAMUSCULAR | Status: AC | PRN
  Administered 2016-06-07: 5 mg via INTRAVENOUS

## 2016-06-07 MED ORDER — KETOROLAC TROMETHAMINE 30 MG/ML IJ SOLN
30.0000 mg | Freq: Four times a day (QID) | INTRAMUSCULAR | Status: AC | PRN
Start: 2016-06-07 — End: 2016-06-08
  Administered 2016-06-07: 30 mg via INTRAMUSCULAR

## 2016-06-07 SURGICAL SUPPLY — 34 items
BENZOIN TINCTURE PRP APPL 2/3 (GAUZE/BANDAGES/DRESSINGS) ×3 IMPLANT
CHLORAPREP W/TINT 26ML (MISCELLANEOUS) ×3 IMPLANT
CLAMP CORD UMBIL (MISCELLANEOUS) IMPLANT
CLOTH BEACON ORANGE TIMEOUT ST (SAFETY) ×3 IMPLANT
CONTAINER PREFILL 10% NBF 15ML (MISCELLANEOUS) IMPLANT
DRSG OPSITE POSTOP 4X10 (GAUZE/BANDAGES/DRESSINGS) ×3 IMPLANT
ELECT REM PT RETURN 9FT ADLT (ELECTROSURGICAL) ×3
ELECTRODE REM PT RTRN 9FT ADLT (ELECTROSURGICAL) ×1 IMPLANT
EXTRACTOR VACUUM M CUP 4 TUBE (SUCTIONS) IMPLANT
EXTRACTOR VACUUM M CUP 4' TUBE (SUCTIONS)
GLOVE BIO SURGEON STRL SZ7.5 (GLOVE) ×3 IMPLANT
GLOVE BIOGEL PI IND STRL 7.0 (GLOVE) ×1 IMPLANT
GLOVE BIOGEL PI INDICATOR 7.0 (GLOVE) ×2
GOWN STRL REUS W/TWL LRG LVL3 (GOWN DISPOSABLE) ×6 IMPLANT
KIT ABG SYR 3ML LUER SLIP (SYRINGE) IMPLANT
NEEDLE HYPO 22GX1.5 SAFETY (NEEDLE) ×3 IMPLANT
NEEDLE HYPO 25X5/8 SAFETYGLIDE (NEEDLE) IMPLANT
NEEDLE SPNL 20GX3.5 QUINCKE YW (NEEDLE) IMPLANT
NS IRRIG 1000ML POUR BTL (IV SOLUTION) ×3 IMPLANT
PACK C SECTION WH (CUSTOM PROCEDURE TRAY) ×3 IMPLANT
PENCIL SMOKE EVAC W/HOLSTER (ELECTROSURGICAL) ×3 IMPLANT
SUT MNCRL 0 VIOLET CTX 36 (SUTURE) ×3 IMPLANT
SUT MNCRL AB 3-0 PS2 27 (SUTURE) ×3 IMPLANT
SUT MON AB 2-0 CT1 27 (SUTURE) ×3 IMPLANT
SUT MON AB-0 CT1 36 (SUTURE) ×6 IMPLANT
SUT MONOCRYL 0 CTX 36 (SUTURE) ×6
SUT PLAIN 0 NONE (SUTURE) IMPLANT
SUT PLAIN 2 0 (SUTURE)
SUT PLAIN 2 0 XLH (SUTURE) ×3 IMPLANT
SUT PLAIN ABS 2-0 CT1 27XMFL (SUTURE) IMPLANT
SYR 20CC LL (SYRINGE) IMPLANT
SYR CONTROL 10ML LL (SYRINGE) ×3 IMPLANT
TOWEL OR 17X24 6PK STRL BLUE (TOWEL DISPOSABLE) ×3 IMPLANT
TRAY FOLEY CATH SILVER 14FR (SET/KITS/TRAYS/PACK) ×3 IMPLANT

## 2016-06-07 NOTE — Anesthesia Procedure Notes (Signed)
Spinal  Patient location during procedure: OR Start time: 06/07/2016 1:11 PM Staffing Anesthesiologist: Josephine Igo Performed: anesthesiologist  Preanesthetic Checklist Completed: patient identified, site marked, surgical consent, pre-op evaluation, timeout performed, IV checked, risks and benefits discussed and monitors and equipment checked Spinal Block Patient position: sitting Prep: site prepped and draped and DuraPrep Patient monitoring: heart rate, cardiac monitor, continuous pulse ox and blood pressure Approach: midline Location: L3-4 Injection technique: single-shot Needle Needle type: Pencan  Needle gauge: 24 G Needle length: 9 cm Assessment Sensory level: T4 Additional Notes Patient tolerated procedure well. Adequate sensory level.

## 2016-06-07 NOTE — H&P (Signed)
Clairessa Zirkelbach is a 35 y.o. female presenting for primary csection for Breech at 40 weeks. See previous note for scheduling delay explanation. Pt declined ECV.  OB History    Gravida Para Term Preterm AB Living   2 0 0 0 1 0   SAB TAB Ectopic Multiple Live Births   1 0 0         Past Medical History:  Diagnosis Date  . Medical history non-contributory    Past Surgical History:  Procedure Laterality Date  . BACK SURGERY     lipoma removal  . TONSILLECTOMY     Family History: family history includes Diabetes in her maternal grandmother and paternal grandfather; Heart attack in her maternal grandmother; Heart disease in her father and paternal grandfather; Hyperlipidemia in her father. Social History:  reports that she has never smoked. She has never used smokeless tobacco. She reports that she drinks alcohol. She reports that she does not use drugs.     Maternal Diabetes: No Genetic Screening: Normal Maternal Ultrasounds/Referrals: Normal Fetal Ultrasounds or other Referrals:  None Maternal Substance Abuse:  No Significant Maternal Medications:  None Significant Maternal Lab Results:  None Other Comments:  None  Review of Systems  Constitutional: Negative.   All other systems reviewed and are negative.  Maternal Medical History:  Fetal activity: Perceived fetal activity is normal.   Last perceived fetal movement was within the past hour.    Prenatal complications: Preterm labor.   Prenatal Complications - Diabetes: none.      Last menstrual period 09/01/2015. Maternal Exam:  Uterine Assessment: Contraction strength is mild.  Contraction frequency is rare.   Abdomen: Patient reports no abdominal tenderness. Fetal presentation: breech  Introitus: Normal vulva. Normal vagina.  Ferning test: not done.  Nitrazine test: not done. Amniotic fluid character: not assessed.  Pelvis: adequate for delivery.   Cervix: Cervix evaluated by digital exam.     Physical Exam   Nursing note and vitals reviewed. Constitutional: She is oriented to person, place, and time. She appears well-developed and well-nourished.  HENT:  Head: Normocephalic and atraumatic.  Neck: Normal range of motion. Neck supple.  Cardiovascular: Normal rate and regular rhythm.   Respiratory: Effort normal and breath sounds normal.  GI: Soft. Bowel sounds are normal.  Genitourinary: Vagina normal and uterus normal.  Musculoskeletal: Normal range of motion.  Neurological: She is alert and oriented to person, place, and time. She has normal reflexes.  Skin: Skin is warm and dry.  Psychiatric: She has a normal mood and affect.    Prenatal labs: ABO, Rh: --/--/A POS, A POS (08/28 1055) Antibody: NEG (08/28 1055) Rubella: Immune (02/17 0000) RPR: Non Reactive (08/28 1055)  HBsAg: Negative (01/03 0000)  HIV: Non-reactive (01/03 0000)  GBS:     Assessment/Plan: 40 week IUP Breech presentation Primary csection. Risks vs benefits discussed.  Consent done.   Jala Dundon J 06/07/2016, 6:06 AM

## 2016-06-07 NOTE — Progress Notes (Signed)
Dr. Ronita Hipps made aware of CCUA results / no need to intervene at this time. Continue routine postpartum orders.  Laury Deep, M MSN, CNM 06/07/2016 11:54 PM

## 2016-06-07 NOTE — Transfer of Care (Signed)
Immediate Anesthesia Transfer of Care Note  Patient: Deborah Petty  Procedure(s) Performed: Procedure(s) with comments: PRIMARY CESAREAN SECTION - BREECH PRESENTATION (N/A) - EDD 06/07/2016  Patient Location: PACU  Anesthesia Type:Spinal  Level of Consciousness: awake  Airway & Oxygen Therapy: Patient Spontanous Breathing  Post-op Assessment: Report given to RN  Post vital signs: Reviewed and stable  Last Vitals:  Vitals:   06/07/16 1048  Pulse: 74  Resp: 18  Temp: 36.7 C    Last Pain:  Vitals:   06/07/16 1048  TempSrc: Oral  PainSc: 1       Patients Stated Pain Goal: 2 ([id number])  Complications: No apparent anesthesia complications

## 2016-06-07 NOTE — Op Note (Signed)
Cesarean Section Procedure Note  Indications: malpresentation: frank breech  Pre-operative Diagnosis: 40 week 0 day pregnancy.  Post-operative Diagnosis: same  Surgeon: Lovenia Kim   Assistants: Renato Battles, CNM  Anesthesia: Local anesthesia 0.25.% bupivacaine and Spinal anesthesia  ASA Class: 2  Procedure Details  The patient was seen in the Holding Room. The risks, benefits, complications, treatment options, and expected outcomes were discussed with the patient.  The patient concurred with the proposed plan, giving informed consent. The risks of anesthesia, infection, bleeding and possible injury to other organs discussed. Injury to bowel, bladder, or ureter with possible need for repair discussed. Possible need for transfusion with secondary risks of hepatitis or HIV acquisition discussed. Post operative complications to include but not limited to DVT, PE and Pneumonia noted. The site of surgery properly noted/marked. The patient was taken to Operating Room # 9, identified as Deborah Petty and the procedure verified as C-Section Delivery. A Time Out was held and the above information confirmed.  After induction of anesthesia, the patient was draped and prepped in the usual sterile manner. A Pfannenstiel incision was made and carried down through the subcutaneous tissue to the fascia. Fascial incision was made and extended transversely using Mayo scissors. The fascia was separated from the underlying rectus tissue superiorly and inferiorly. The peritoneum was identified and entered. Peritoneal incision was extended longitudinally. The utero-vesical peritoneal reflection was incised transversely and the bladder flap was bluntly freed from the lower uterine segment. A low transverse uterine incision(Kerr hysterotomy) was made. Delivered from frank breech presentation with standard maneuvers and gentle flexion of the fetal vertex was a  female with Apgar scores of 8 at one minute and 9 at five  minutes. Bulb suctioning gently performed. Neonatal team in attendance.After the umbilical cord was clamped and cut cord blood was obtained for evaluation. The placenta was removed intact and appeared normal. The uterus was curetted with a dry lap pack. 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 The uterine incision was closed with running locked sutures of 0 Monocryl x 2 layers. Hemostasis was observed. Lavage was carried out until clear.The parietal peritoneum was closed with a running 2-0 Monocryl suture. The fascia was then reapproximated with running sutures of 0 Monocryl. The skin was reapproximated with 3-0 monocryl after Cornelius closure with 2-0 plain.  Instrument, sponge, and needle counts were correct prior the abdominal closure and at the conclusion of the case.   Findings: FTLM, posterior placenta  Estimated Blood Loss:  500         Drains: foley                 Specimens: placenta                 Complications:  None; patient tolerated the procedure well.         Disposition: PACU - hemodynamically stable.         Condition: stable  Attending Attestation: I performed the procedure.

## 2016-06-07 NOTE — Anesthesia Preprocedure Evaluation (Signed)
Anesthesia Evaluation  Patient identified by MRN, date of birth, ID band Patient awake    Reviewed: Allergy & Precautions, NPO status , Patient's Chart, lab work & pertinent test results  History of Anesthesia Complications Negative for: history of anesthetic complications  Airway Mallampati: II  TM Distance: >3 FB Neck ROM: Full    Dental no notable dental hx. (+) Dental Advisory Given   Pulmonary neg pulmonary ROS,    Pulmonary exam normal breath sounds clear to auscultation       Cardiovascular negative cardio ROS Normal cardiovascular exam Rhythm:Regular Rate:Normal     Neuro/Psych negative neurological ROS  negative psych ROS   GI/Hepatic negative GI ROS, Neg liver ROS,   Endo/Other  negative endocrine ROS  Renal/GU negative Renal ROS  negative genitourinary   Musculoskeletal negative musculoskeletal ROS (+)   Abdominal   Peds negative pediatric ROS (+)  Hematology negative hematology ROS (+)   Anesthesia Other Findings   Reproductive/Obstetrics (+) Pregnancy breech                             Anesthesia Physical Anesthesia Plan  ASA: II  Anesthesia Plan: Spinal   Post-op Pain Management:    Induction:   Airway Management Planned:   Additional Equipment:   Intra-op Plan:   Post-operative Plan:   Informed Consent: I have reviewed the patients History and Physical, chart, labs and discussed the procedure including the risks, benefits and alternatives for the proposed anesthesia with the patient or authorized representative who has indicated his/her understanding and acceptance.   Dental advisory given  Plan Discussed with: CRNA  Anesthesia Plan Comments:         Anesthesia Quick Evaluation

## 2016-06-07 NOTE — Progress Notes (Signed)
Patient ID: Deborah Petty, female   DOB: Sep 11, 1981, 35 y.o.   MRN: [id number] Patient seen and examined. Consent witnessed and signed. No changes noted. Update completed.

## 2016-06-08 ENCOUNTER — Encounter (HOSPITAL_COMMUNITY): Payer: Self-pay | Admitting: Obstetrics and Gynecology

## 2016-06-08 LAB — CBC
HEMATOCRIT: 30.7 % — AB (ref 36.0–46.0)
HEMOGLOBIN: 10.8 g/dL — AB (ref 12.0–15.0)
MCH: 29.9 pg (ref 26.0–34.0)
MCHC: 35.2 g/dL (ref 30.0–36.0)
MCV: 85 fL (ref 78.0–100.0)
Platelets: 120 10*3/uL — ABNORMAL LOW (ref 150–400)
RBC: 3.61 MIL/uL — AB (ref 3.87–5.11)
RDW: 13.8 % (ref 11.5–15.5)
WBC: 15.1 10*3/uL — ABNORMAL HIGH (ref 4.0–10.5)

## 2016-06-08 LAB — RPR: RPR Ser Ql: NONREACTIVE

## 2016-06-08 MED ORDER — OXYCODONE HCL 5 MG PO TABS
5.0000 mg | ORAL_TABLET | ORAL | Status: DC | PRN
  Administered 2016-06-08 – 2016-06-10 (×7): 5 mg via ORAL
  Filled 2016-06-08 (×7): qty 1

## 2016-06-08 MED ORDER — LACTATED RINGERS IV BOLUS (SEPSIS)
500.0000 mL | Freq: Once | INTRAVENOUS | Status: AC
  Administered 2016-06-08: 500 mL via INTRAVENOUS

## 2016-06-08 MED ORDER — OXYCODONE HCL 5 MG PO TABS
10.0000 mg | ORAL_TABLET | ORAL | Status: DC | PRN
  Administered 2016-06-08 – 2016-06-10 (×2): 10 mg via ORAL
  Filled 2016-06-08 (×2): qty 2

## 2016-06-08 NOTE — Progress Notes (Signed)
MOB was referred for history of depression/anxiety.  Referral is screened out by Clinical Social Worker because none of the following criteria appear to apply and  there are no reports impacting the pregnancy or her transition to the postpartum period. CSW does not deem it clinically necessary to further investigate at this time.  -History of anxiety/depression during this pregnancy, or of post-partum depression.  - Diagnosis of anxiety and/or depression within last 3 years.-  2013/2014 dx with no notations affecting pregnancy in medical record. - History of depression due to pregnancy loss/loss of child or -MOB's symptoms are currently being treated with medication and/or therapy.  Please contact the Clinical Social Worker if needs arise or upon MOB request.     Lane Hacker, MSW Clinical Social Work: System Print production planner for [employer] social worker [phone]

## 2016-06-08 NOTE — Lactation Note (Signed)
This note was copied from a baby's chart. Lactation Consultation Note  Patient Name: Deborah Petty Today's Date: 06/08/2016 Reason for consult: Follow-up assessment Mom states baby has been sleepy today.  Baby is currently sleeping on mom's chest.  Waking techniques done and clothes removed.  Baby placed skin to skin in laid back position. Baby latched well after a few attempts and nursed actively.  Breast massage encouraged to increase milk flow.  Instructed to feed with any cue and to call for assist prn.  Mom also post pumping to stimulate milk supply.  Maternal Data    Feeding Feeding Type: Breast Fed Length of feed: 20 min  LATCH Score/Interventions Latch: Grasps breast easily, tongue down, lips flanged, rhythmical sucking. Intervention(s): Teach feeding cues;Waking techniques  Audible Swallowing: A few with stimulation Intervention(s): Skin to skin;Hand expression;Alternate breast massage  Type of Nipple: Everted at rest and after stimulation  Comfort (Breast/Nipple): Soft / non-tender     Hold (Positioning): Assistance needed to correctly position infant at breast and maintain latch. Intervention(s): Breastfeeding basics reviewed;Support Pillows;Position options;Skin to skin  LATCH Score: 8  Lactation Tools Discussed/Used     Consult Status Consult Status: Follow-up Date: 06/09/16 Follow-up type: In-patient    Ave Filter 06/08/2016, 3:15 PM

## 2016-06-08 NOTE — Progress Notes (Signed)
POSTOPERATIVE DAY # 1 S/P breech malpresentation   S:         Reports feeling ok             Tolerating po intake / no nausea / no vomiting / no flatus / no BM             Bleeding is light             Pain controlled with motrin             Up ad lib / ambulatory/ voiding QS  Newborn breast feeding   O:  VS: BP (!) 88/56 Comment: dawson CNM notified  Pulse 70   Temp 98.1 F (36.7 C)   Resp 18   Ht 5\' 7"  (1.702 m)   Wt 80.3 kg (177 lb)   LMP 09/01/2015 (Within Days)   SpO2 97%   Breastfeeding? Unknown   BMI 27.72 kg/m   Baseline BP low per prenatal records  LABS:               Recent Labs  06/07/16 1035 06/08/16 0535  WBC 12.7* 15.1*  HGB 14.4 10.8*  PLT 157 120*               Bloodtype: --/--/A POS (09/05 1035)  Rubella: Immune (02/17 0000)                                             I&O: Intake/Output      09/05 0701 - 09/06 0700 09/06 0701 - 09/07 0700   I.V. (mL/kg) 2500 (31.1)    Total Intake(mL/kg) 2500 (31.1)    Urine (mL/kg/hr) 2075    Blood 800    Total Output 2875     Net -375                      Physical Exam:             Alert and Oriented X3  Lungs: Clear and unlabored  Heart: regular rate and rhythm / no mumurs  Abdomen: soft, non-tender, non-distended              Fundus: firm, non-tender, Ueven             Dressing intact          Perineum: intact  Lochia: light  Extremities: SCD in place  A:        POD #  S/P CS             P:        Routine postoperative care                   Artelia Laroche CNM, MSN, [facility] 06/08/2016, 9:39 AM

## 2016-06-08 NOTE — Plan of Care (Signed)
Problem: Activity: Goal: Ability to tolerate increased activity will improve Up t;o bathroom tolerating well.  States ambulating much better

## 2016-06-08 NOTE — Lactation Note (Signed)
This note was copied from a baby's chart. Lactation Consultation Note  Patient Name: Deborah Petty Today's Date: 06/08/2016 Reason for consult: Follow-up assessment Baby at 30 hr of life and mom was requesting help with latch to the L breast. Mom has wide spaced small breast with a small areola and large nipple. Colostrum is easily expressed. Baby has a high palate, a noticeable lingual frenulum with a posterior insertion point, a recessed chin, and a thick upper labial frenulum with an insertion point at the bottom of the gum ridge. Baby can lift tongue to roof, extend tongue past gum ridge, and has good lateralization of tongue. There is a line of small white dots along the midline of the palate in the baby's mouth, possibly Epstein pearls. Baby was able to latch easily to the L breast in a cross cradle position. Mom reported "some" pinching when baby first latched that improve after 90 seconds. Reviewed nipple care. Parents are aware of lactation services and support group. They will call as needed.   Maternal Data    Feeding Feeding Type: Breast Fed Length of feed: 12 min  LATCH Score/Interventions Latch: Grasps breast easily, tongue down, lips flanged, rhythmical sucking. Intervention(s): Skin to skin;Teach feeding cues;Waking techniques Intervention(s): Adjust position;Assist with latch  Audible Swallowing: A few with stimulation Intervention(s): Skin to skin;Hand expression Intervention(s): Alternate breast massage  Type of Nipple: Everted at rest and after stimulation  Comfort (Breast/Nipple): Soft / non-tender     Hold (Positioning): Full assist, staff holds infant at breast Intervention(s): Support Pillows;Position options  LATCH Score: 7  Lactation Tools Discussed/Used     Consult Status Consult Status: Follow-up Date: 06/09/16 Follow-up type: In-patient    Denzil Hughes 06/08/2016, 8:25 PM

## 2016-06-08 NOTE — Lactation Note (Addendum)
This note was copied from a baby's chart. Lactation Consultation Note New mom states has difficulty latching to Lt. Breast. Has latched to Rt. Ok. Mom states had a lot of good feedings yesterday, but has been very sleepy and hasn't wanted to BF during the night since 0230. Educated on newborn behavior, feeding habits, sleeping patterns, STS, cluster feeding, supply and demand. Mom has very small breast, 5 finger wide space between breast. Very little breast tissue between breast. Most of tissue are posterior outter breast. Good everted nipple, areola small around nipple. Hand expression taught w/easily expressed colostrum. Mom encouraged to feed baby 8-12 times/24 hours and with feeding cues. Mom shown how to use DEBP & how to disassemble, clean, & reassemble parts. Mom knows to pump q3h for 15-20 min. Referred to Baby and Me Book in Breastfeeding section Pg. 22-23 for position options and Proper latch demonstration. Encouraged comfort during BF so colostrum flows better and mom will enjoy the feeding longer. Taking deep breaths and breast massage during BF. Attempted to wake baby for feeding assistance. Baby sleeping soundly in moms arms. Encouraged to hand express and spoon feed to stimulate to BF as well as STS. Mom has a Medela DEBP. Encouraged to use hospital grade pump for extra stimulation. Mom had several questions, one was pacifier use. [place] brochure given w/resources, support groups and [place] services. RN had given #20 NS. I encouraged mom not to use it until [facility] evaluate. Mom has good everted nipples. RN gave it d/t baby not wanting to latch to Lt. Nipple. LC feels that with positioning assistance baby will latch.  Patient Name: Deborah Petty Today's Date: 06/08/2016 Reason for consult: Initial assessment   Maternal Data Has patient been taught Hand Expression?: Yes Does the patient have breastfeeding experience prior to this delivery?: No  Feeding    LATCH Score/Interventions Latch: Too  sleepy or reluctant, no latch achieved, no sucking elicited.     Type of Nipple: Everted at rest and after stimulation  Comfort (Breast/Nipple): Soft / non-tender     Intervention(s): Breastfeeding basics reviewed;Support Pillows;Position options;Skin to skin     Lactation Tools Discussed/Used Tools: Pump Breast pump type: Double-Electric Breast Pump WIC Program: No Pump Review: Setup, frequency, and cleaning;Milk Storage Initiated by:: Allayne Stack RN IBCLC Date initiated:: 06/08/16   Consult Status Consult Status: Follow-up Date: 06/08/16 Follow-up type: In-patient    Theodoro Kalata 06/08/2016, 6:48 AM

## 2016-06-09 LAB — GLUCOSE, CAPILLARY: Glucose-Capillary: 97 mg/dL (ref 65–99)

## 2016-06-09 MED ORDER — METOCLOPRAMIDE HCL 5 MG/ML IJ SOLN
10.0000 mg | Freq: Once | INTRAMUSCULAR | Status: AC
Start: 2016-06-09 — End: 2016-06-09
  Administered 2016-06-09: 10 mg via INTRAVENOUS
  Filled 2016-06-09: qty 2

## 2016-06-09 NOTE — Progress Notes (Signed)
Patient called out saying she didn't "feel good" so I went in to check on her and she said she was feeling nauseous, dizzy, lightheaded, and short of breath. I took her blood sugar and it was 97, then I took her blood pressure and it was 88/63, and then I gave her zofran. Sydney checked her pulse ox and it was 99%.

## 2016-06-09 NOTE — Progress Notes (Signed)
At [place] was notified of pt not feeling well and low BP.  She stated she thought it was related to the oxycodone and recommended we give only 5mg  of oxy IR and mom continue to hydrate well.

## 2016-06-09 NOTE — Progress Notes (Signed)
Pt is still nauseated and cannot have zofran until [place] notified and order for reglan received and given to pt.  Will continue to monitor.

## 2016-06-09 NOTE — Lactation Note (Signed)
This note was copied from a baby's chart. Lactation Consultation Note  Patient Name: Boy Fatin Medal Today's Date: 06/09/2016 Reason for consult: Follow-up assessment Baby at 51 hr of life and starting dbl photo therapy. Mom reports baby is still having trouble latching to the L breast. Upon entry baby was latched to the R breast doing non nutritive suckling. Mom took baby off the breast and FOB tried to wake baby. Baby was too sleep at this visit to latch. Mom will offer the breast 8+/24hr and f/u bf with manual expression/spoon feeding. Parents are aware of lactation services and support group. They will call as needed.    Maternal Data    Feeding Feeding Type: Breast Fed Length of feed: 0 min  LATCH Score/Interventions Latch: Too sleepy or reluctant, no latch achieved, no sucking elicited.                    Lactation Tools Discussed/Used     Consult Status Consult Status: Follow-up Date: 06/10/16 Follow-up type: In-patient    Denzil Hughes 06/09/2016, 4:46 PM

## 2016-06-09 NOTE — Anesthesia Postprocedure Evaluation (Signed)
Anesthesia Post Note  Patient: Deborah Petty  Procedure(s) Performed: Procedure(s) (LRB): PRIMARY CESAREAN SECTION - BREECH PRESENTATION (N/A)  Patient location during evaluation: PACU Anesthesia Type: Spinal Level of consciousness: oriented and awake and alert Pain management: pain level controlled Vital Signs Assessment: post-procedure vital signs reviewed and stable Respiratory status: spontaneous breathing, respiratory function stable and patient connected to nasal cannula oxygen Cardiovascular status: blood pressure returned to baseline and stable Postop Assessment: no headache, spinal receding, no signs of nausea or vomiting and no backache Anesthetic complications: no              Abdirizak Richison A.

## 2016-06-09 NOTE — Progress Notes (Addendum)
Patient ID: Deborah Petty, female   DOB: 1981/08/10, 35 y.o.   MRN: [id number] POD # 2  Subjective: Pt reports feeling fatigue, sore / Pain controlled with percocet  Tolerating po/ Foley d/c'ed /Voiding without problems/ No n/v/Flatus pos Activity: out of bed and ambulate Bleeding is light  Episode of hypotension overnight and pt c/o dizziness, headache Newborn info:  Information for the patient's newborn:  Deborah, Petty Tanette [id number]  female  / circ to be done per Dr Ronita Hipps / Feeding: breast   Objective: VS: Blood pressure 118/70  pulse 71, temperature 98.3 F (36.8 C), temperature source Oral, resp. rate 19   Intake/Output Summary (Last 24 hours) at 06/09/16 1053 Last data filed at 06/08/16 1821  Gross per 24 hour  Intake              480 ml  Output             3250 ml  Net            -2770 ml      Recent Labs  06/07/16 1035 06/08/16 0535  WBC 12.7* 15.1*  HGB 14.4 10.8*  HCT 40.1 30.7*  PLT 157 120*    Blood type: --/--/A POS (09/05 1035) Rubella: Immune (02/17 0000)    Physical Exam:  General: alert, cooperative and no distress CV: Regular rate and rhythm Resp: clear Abdomen: soft, nontender, normal bowel sounds Incision: well approximated Uterine Fundus: firm, below umbilicus, nontender Lochia: minimal Ext: no edema, redness or tenderness in the calves or thighs    A/P: POD # 2 G2P1011 S/P Primary C/Section d/t breech  ABL Anemia; will add fe supp POD 2 Hypotension improved; urged increased po fluids and small frequent meals.  OOB slowly Doing well Continue routine post op orders   Signed: Darleen Crocker, MSN, [facility] 06/09/2016, 10:53 AM

## 2016-06-10 MED ORDER — POLYSACCHARIDE IRON COMPLEX 150 MG PO CAPS: 150.0000 mg | ORAL_CAPSULE | Freq: Every day | ORAL | Status: DC

## 2016-06-10 MED ORDER — POLYSACCHARIDE IRON COMPLEX 150 MG PO CAPS: 150.0000 mg | ORAL_CAPSULE | Freq: Every day | ORAL | 1 refills | Status: DC

## 2016-06-10 MED ORDER — OXYCODONE HCL 10 MG PO TABS: ORAL_TABLET | ORAL | 0 refills | Status: DC

## 2016-06-10 MED ORDER — IBUPROFEN 600 MG PO TABS: 600.0000 mg | ORAL_TABLET | Freq: Four times a day (QID) | ORAL | 0 refills | Status: DC

## 2016-06-10 MED ORDER — SIMETHICONE 80 MG PO CHEW: 80.0000 mg | CHEWABLE_TABLET | ORAL | 0 refills | Status: DC

## 2016-06-10 MED ORDER — COCONUT OIL OIL: 1.0000 "application " | TOPICAL_OIL | 0 refills | Status: DC | PRN

## 2016-06-10 NOTE — Lactation Note (Addendum)
This note was copied from a baby's chart. Lactation Consultation Note Mom was about to start feeding when Dexter entered rm. Mom stated she needed help. FOB was at [facility] w/5 FR. [place] w/formula taped onto moms breast for supplementing. Assisted baby in football hold. Baby arching screaming pushing away from breast. Baby would latch then only suck on tube. Repositioned latch several times. Baby wouldn't sustain open latch. And cont. To fight BF. Fitted and taught application of [id number] NS to Lt. Breast. Tubing inserted into NS. Baby latched right away to NS. Then FOB pushed some formula into NS.  Moms breast are filling. Lt. Breast (smallest) was filling as well w/small enlarged ducts. Demonstrated breast massage and hand expression. Baby BF well to Lt. Breast w/NS.  Massage Rt. Breast that was filling w/knots. Has hard milk shelf. Taught breast massage. After feeding gave mom DEBP to pump breast. Discussed prior to pumping supplementing w/colostrum. Doesn't have colostrum to supplement with. Encouraged to BF a few minutes to soften breast and get mom's milk first, then can supplement. Supplementing will hopefull stop after milk comes in. Monitoring for adequate milk supply will determine if supplementing needs to be cont.  Stressed importance of post pumping for breast stimulation, and giving colostrum as supplement, then give formula if needed. Encouraged to feel breast before and after BF to assess for transfer of milk from breast.  Mom needs a lot of encouragement. Has uplifted spirit at this time. Has rested well, no tears at this time.  Baby is VERY jaundice on DPT. Off of lights during BF. Baby has a large stool and pee while BF.  Stressed strict I&O and post pumping. Educated on breast filling, engorgement, prevention and management.  Patient Name: [name] Today's Date: 06/10/2016 Reason for consult: Follow-up assessment;Breast/nipple pain;Difficult latch;Infant weight  loss;Hyperbilirubinemia   Maternal Data    Feeding Feeding Type: Breast Fed Length of feed: 20 min  LATCH Score/Interventions Latch: Repeated attempts needed to sustain latch, nipple held in mouth throughout feeding, stimulation needed to elicit sucking reflex. Intervention(s): Skin to skin;Teach feeding cues;Waking techniques Intervention(s): Adjust position;Assist with latch;Breast massage;Breast compression  Audible Swallowing: A few with stimulation Intervention(s): Hand expression;Skin to skin Intervention(s): Alternate breast massage  Type of Nipple: Everted at rest and after stimulation  Comfort (Breast/Nipple): Filling, red/small blisters or bruises, mild/mod discomfort  Problem noted: Mild/Moderate discomfort;Filling Interventions (Filling): Massage;Frequent nursing;Double electric pump Interventions (Mild/moderate discomfort): Hand massage;Hand expression;Post-pump;Breast shields  Hold (Positioning): Full assist, staff holds infant at breast Intervention(s): Support Pillows;Position options;Skin to skin;Breastfeeding basics reviewed  LATCH Score: 5  Lactation Tools Discussed/Used Tools: Nipple Shields;Pump;11F feeding tube / Syringe Nipple shield size: 20;24 Breast pump type: Double-Electric Breast Pump   Consult Status Consult Status: Follow-up Date: 06/10/16 Follow-up type: In-patient    Theodoro Kalata 06/10/2016, 4:51 AM

## 2016-06-10 NOTE — Discharge Summary (Signed)
OB Discharge Summary     Patient Name: Deborah Petty DOB: 12/18/1980 MRN: [id number]  Date of admission: 06/07/2016 Delivering MD: Brien Few   Date of discharge: 06/10/2016  Admitting diagnosis: primary  breech Intrauterine pregnancy: [redacted]w[redacted]d     Secondary diagnosis:  Principal Problem:   Postpartum care following cesarean delivery (9/5) Active Problems:   Breech presentation   Breech birth  Additional problems: none     Discharge diagnosis: Term Pregnancy Delivered and post-op day 3, stable                                                                                                Post partum procedures:none  Augmentation: N/A  Complications: None  Hospital course:  Sceduled C/S   35 y.o. yo G2P1011 at [redacted]w[redacted]d was admitted to the hospital 06/07/2016 for scheduled cesarean section with the following indication:Malpresentation.  Membrane Rupture Time/Date: 1:34 PM ,06/07/2016   Patient delivered a Viable infant.06/07/2016  Details of operation can be found in separate operative note.  Pateint had an uncomplicated postpartum course.  She is ambulating, tolerating a regular diet, passing flatus, and urinating well. Patient is discharged to boarding status in stable condition on  06/10/16          Physical exam Vitals:   06/09/16 1555 06/09/16 1810 06/09/16 2030 06/10/16 0633  BP: 97/68 100/60 104/65 103/63  Pulse: 67 81  70  Resp: 20 18  17   Temp: 98 F (36.7 C) 98 F (36.7 C)  98.5 F (36.9 C)  TempSrc: Oral Oral  Oral  SpO2: 100%     Weight:      Height:       General: alert, cooperative and no distress Lochia: appropriate Uterine Fundus: firm Incision: Healing well with no significant drainage DVT Evaluation: No significant calf/ankle edema. Labs: Lab Results  Component Value Date   WBC 15.1 (H) 06/08/2016   HGB 10.8 (L) 06/08/2016   HCT 30.7 (L) 06/08/2016   MCV 85.0 06/08/2016   PLT 120 (L) 06/08/2016   No flowsheet data found.  Discharge instruction:  per After Visit Summary and "Baby and Me Booklet".  After visit meds:    Medication List    TAKE these medications   acetaminophen 325 MG tablet Commonly known as:  TYLENOL Take 650 mg by mouth every 6 (six) hours as needed for mild pain.   coconut oil Oil Apply 1 application topically as needed.   docusate sodium 100 MG capsule Commonly known as:  COLACE Take 100 mg by mouth daily as needed for mild constipation or moderate constipation.   ibuprofen 600 MG tablet Commonly known as:  ADVIL,MOTRIN Take 1 tablet (600 mg total) by mouth every 6 (six) hours.   iron polysaccharides 150 MG capsule Commonly known as:  NIFEREX Take 1 capsule (150 mg total) by mouth daily.   Oxycodone HCl 10 MG Tabs 1-2 tabs PO q 4-6 hrs PRN   prenatal multivitamin Tabs tablet Take 1 tablet by mouth at bedtime.   ranitidine 150 MG tablet Commonly known as:  ZANTAC Take 150 mg by mouth daily  as needed for heartburn.   simethicone 80 MG chewable tablet Commonly known as:  MYLICON Chew 1 tablet (80 mg total) by mouth daily.       Diet: routine diet  Activity: Advance as tolerated. Pelvic rest for 6 weeks.   Outpatient follow up:6 weeks Follow up Appt:No future appointments. Follow up Visit:No Follow-up on file.  Postpartum contraception: Not Discussed  Newborn Data: Live born female "Chase" Birth Weight: 8 lb 6 oz (3800 g) APGAR: 8, 9  Baby Feeding: Bottle and Breast Disposition:rooming in   06/10/2016 Juliene Pina, CNM

## 2016-06-10 NOTE — Progress Notes (Signed)
Post discharge chart review completed.  

## 2016-06-10 NOTE — Lactation Note (Signed)
This note was copied from a baby's chart. Lactation Consultation Note  Patient Name: Deborah Petty Today's Date: 06/10/2016 Reason for consult: Follow-up assessment;Hyperbilirubinemia;Infant weight loss  Baby 44 hours old. Patient's bedside RN, Rise Paganini, at bedside and had just latched baby to right breast in football position. Baby able to maintain a deep latch with lips flanged, and suckled rhythmically with a few swallows noted. Mom had to continue stimulating baby to keep baby suckling. Mom reports that baby nursed well on left breast--the more difficult to latch--for about 20 minutes, just prior to latching to the right breast. FOB intends to supplement/finger-feed with EBM--which mom has at bedside, while mom uses DEBP. Mom reports that her breasts are starting to fill, and she has tight areas which LC noted with light palpation. Enc mom to massage and pump/hand express to soften, and discussed engorgement prevention/treatment methods--and enc mom to nurse often. Reviewed hand expression and mom reports that she has a personal DEBP.    Plan is for mom to nurse baby with cues, and at least every 3 hours. Enc parents to continue to supplement until mom's milk comes to volume, and discussed ways of knowing baby getting enough at the breast. Enc mom to postpump, and reviewed Baby and Me booklet for EBM storage guidelines and number of diapers to expect by day of life. Parents aware of OP/BFSG and [place] phone line assistance after D/C.    Maternal Data    Feeding Feeding Type: Breast Fed Length of feed: 20 min  LATCH Score/Interventions Latch: Grasps breast easily, tongue down, lips flanged, rhythmical sucking.  Audible Swallowing: A few with stimulation Intervention(s): Hand expression Intervention(s): Skin to skin  Type of Nipple: Everted at rest and after stimulation  Comfort (Breast/Nipple): Filling, red/small blisters or bruises, mild/mod discomfort  Problem noted:  Filling;Mild/Moderate discomfort Interventions (Filling): Massage;Frequent nursing Interventions (Mild/moderate discomfort): Hand massage;Hand expression;Post-pump  Hold (Positioning): Assistance needed to correctly position infant at breast and maintain latch. Intervention(s): Breastfeeding basics reviewed  LATCH Score: 7  Lactation Tools Discussed/Used Tools: Pump   Consult Status Consult Status: Follow-up Date: 06/11/16 Follow-up type: In-patient    Andres Labrum 06/10/2016, 11:09 AM

## 2016-06-10 NOTE — Progress Notes (Signed)
Patient ID: Deborah Petty, female   DOB: 24-Sep-1981, 35 y.o.   MRN: [id number] Subjective: POD# 3 Information for the patient's newborn:  Deborah, Petty Deborah Petty  female   circ completed Baby name: Deborah Petty Remains inpatient - jaundice and poor feeding/phototherapy  Reports feeling dizzy and shaky with ambulation, also persitent nausea, no emesis. Passing gas, no BM yet. Pain controlled w/ PO meds. Bleeding small, decreasing. Tolerating PO. Feeding: breast and bottle, working w/ LC for latch.    Objective:   VS:    Vitals:   06/09/16 1555 06/09/16 1810 06/09/16 2030 06/10/16 0633  BP: 97/68 100/60 104/65 103/63  Pulse: 67 81  70  Resp: 20 18  17   Temp: 98 F (36.7 C) 98 F (36.7 C)  98.5 F (36.9 C)  TempSrc: Oral Oral  Oral  SpO2: 100%     Weight:      Height:        No intake or output data in the 24 hours ending 06/10/16 1107      Recent Labs  06/08/16 0535  WBC 15.1*  HGB 10.8*  HCT 30.7*  PLT 120*     Blood type: --/--/A POS (09/05 1035)  Rubella: Immune (02/17 0000)     Physical Exam:  General: alert, cooperative and no distress CV: Regular rate and rhythm Resp: clear Abdomen: soft, nontender, normal bowel sounds Incision: clean, dry and intact Uterine Fundus: firm, below umbilicus, nontender Lochia: minimal Ext: edema  trace pedal   Assessment/Plan: 35 y.o.   POD# 3. [id number]                  Principal Problem:   Postpartum care following cesarean delivery (9/5) Active Problems:   Breech presentation   Breech birth   Doing well, stable.    Routine post-op care DC to boarding status.  Deborah Petty, CNM, MSN 06/10/2016, 11:07 AM

## 2016-06-10 NOTE — Discharge Instructions (Signed)
Breast Pumping Tips °If you are breastfeeding, there may be times when you cannot feed your baby directly. Returning to work or going on a trip are common examples. Pumping allows you to store breast milk and feed it to your baby later.  °You may not get much milk when you first start to pump. Your breasts should start to make more after a few days. If you pump at the times you usually feed your baby, you may be able to keep making enough milk to feed your baby without also using formula. The more often you pump, the more milk you will produce.  °WHEN SHOULD I PUMP?  °· You can begin to pump soon after delivery. However, some experts recommend waiting about 4 weeks before giving your infant a bottle to make sure breastfeeding is going well.  °· If you plan to return to work, begin pumping a few weeks before. This will help you develop techniques that work best for you. It also lets you build up a supply of breast milk.   °· When you are with your infant, feed on demand and pump after each feeding.   °· When you are away from your infant for several hours, pump for about 15 minutes every 2-3 hours. Pump both breasts at the same time if you can.   °· If your infant has a formula feeding, make sure to pump around the same time.     °· If you drink any alcohol, wait 2 hours before pumping.   °HOW DO I PREPARE TO PUMP? °Your let-down reflex is the natural reaction to stimulation that makes your breast milk flow. It is easier to stimulate this reflex when you are relaxed. Find relaxation techniques that work for you. If you have difficulty with your let-down reflex, try these methods:  °· Smell one of your infant's blankets or an item of clothing.   °· Look at a picture or video of your infant.   °· Sit in a quiet, private space.   °· Massage the breast you plan to pump.   °· Place soothing warmth on the breast.   °· Play relaxing music.   °WHAT ARE SOME GENERAL BREAST PUMPING TIPS? °· Wash your hands before you pump. You  do not need to wash your nipples or breasts. °· There are three ways to pump. °¨ You can use your hand to massage and compress your breast. °¨ You can use a handheld manual pump. °¨ You can use an electric pump.   °· Make sure the suction cup (flange) on the breast pump is the right size. Place the flange directly over the nipple. If it is the wrong size or placed the wrong way, it may be painful and cause nipple damage.   °· If pumping is uncomfortable, apply a small amount of purified or modified lanolin to your nipple and areola. °· If you are using an electric pump, adjust the speed and suction power to be more comfortable. °· If pumping is painful or if you are not getting very much milk, you may need a different type of pump. A lactation consultant can help you determine what type of pump to use.   °· Keep a full water bottle near you at all times. Drinking lots of fluid helps you make more milk.  °· You can store your milk to use later. Pumped breast milk can be stored in a sealable, sterile container or plastic bag. Label all stored breast milk with the date you pumped it. °¨ Milk can stay out at room temperature for up to 8 hours. °¨   You can store your milk in the refrigerator for up to 8 days.  You can store your milk in the freezer for 3 months. Thaw frozen milk using warm water. Do not put it in the microwave.  Do not smoke. Smoking can lower your milk supply and harm your infant. If you need help quitting, ask your health care provider to recommend a program.  [place] A LACTATION CONSULTANT?  You are having trouble pumping.  You are concerned that you are not making enough milk.  You have nipple pain, soreness, or redness.  You want to use birth control. Birth control pills may lower your milk supply. Talk to your health care provider about your options.   This information is not intended to replace advice given to you by your health care provider.  Make sure you discuss any questions you have with your health care provider.   Document Released: 03/09/2010 Document Revised: 09/24/2013 Document Reviewed: 07/12/2013 Elsevier Interactive Patient Education [employer].

## 2016-06-11 ENCOUNTER — Ambulatory Visit: Payer: Self-pay

## 2016-06-11 NOTE — Lactation Note (Signed)
This note was copied from a baby's chart. Lactation Consultation Note  Baby 38 hours old and being rechecked for jaundice.  Currently sleeping. Mother's breasts are filling and she has ice packs on breasts. Baby's weight has increased.  He is being supplemented in addition to breastfeeding. Recommend parents call for next feeding to [facility] to view and update feeding plan. Mother recently pumped approx 8 ml of colostrum. She is post pumping and using SNS when able but at time is supplementing with finger syringe.   Patient Name: Deborah Petty Today's Date: 06/11/2016     Maternal Data    Feeding    LATCH Score/Interventions                      Lactation Tools Discussed/Used     Consult Status      Vivianne Master [facility] 06/11/2016, 9:35 AM

## 2016-06-11 NOTE — Lactation Note (Signed)
This note was copied from a baby's chart. Lactation Consultation Note  Baby 29 hours old.  Bilirubin level has decreased. Mother recently pumped 10 ml. Mother easily expressed drops of transitional milk. Breasts are filling. Baby latched in cross cradle.  Intermittent sucks and swallows observed. Plan is to continue to post pump 5-6 times a day and give baby back what is pumped at next feeding. Give the difference w/ Alimentum based on infant's demand.  Currently 20-30 ml. As breastmilk supply increases she can decrease formula supplementation. Parents will give supplement w/ SNS at breast or finger syringe feed.  As volume increases, if baby is still latching well, can supplement w/ slow flow nipple. Reviewed engorgement care and monitoring voids/stools. Parents are aware of tight labial frenulum, cupped tongue and short posterior frenulum. Offered OP appt.  Parents will call if needed.  Patient Name: Boy Tyra Bendix Today's Date: 06/11/2016 Reason for consult: Follow-up assessment   Maternal Data    Feeding Feeding Type: Breast Fed  LATCH Score/Interventions Latch: Grasps breast easily, tongue down, lips flanged, rhythmical sucking. Intervention(s): Skin to skin Intervention(s): Breast massage;Breast compression  Audible Swallowing: Spontaneous and intermittent Intervention(s): Hand expression;Skin to skin Intervention(s): Skin to skin  Type of Nipple: Everted at rest and after stimulation  Comfort (Breast/Nipple): Filling, red/small blisters or bruises, mild/mod discomfort  Problem noted: Mild/Moderate discomfort Interventions (Filling): Double electric pump Interventions  (Cracked/bleeding/bruising/blister): Expressed breast milk to nipple Interventions (Mild/moderate discomfort): Hand expression;Post-pump  Hold (Positioning): No assistance needed to correctly position infant at breast.  LATCH Score: 9  Lactation Tools Discussed/Used     Consult Status Consult  Status: Complete    Carlye Grippe 06/11/2016, 10:52 AM

## 2017-10-24 DIAGNOSIS — D171 Benign lipomatous neoplasm of skin and subcutaneous tissue of trunk: Secondary | ICD-10-CM

## 2017-10-24 HISTORY — DX: Benign lipomatous neoplasm of skin and subcutaneous tissue of trunk: D17.1

## 2017-11-13 DIAGNOSIS — Z09 Encounter for follow-up examination after completed treatment for conditions other than malignant neoplasm: Secondary | ICD-10-CM | POA: Insufficient documentation

## 2018-08-08 DIAGNOSIS — R202 Paresthesia of skin: Secondary | ICD-10-CM

## 2018-08-08 HISTORY — DX: Paresthesia of skin: R20.2

## 2018-09-18 DIAGNOSIS — G8929 Other chronic pain: Secondary | ICD-10-CM

## 2018-09-18 DIAGNOSIS — M545 Low back pain, unspecified: Secondary | ICD-10-CM

## 2018-09-18 HISTORY — DX: Low back pain, unspecified: M54.50

## 2018-09-18 HISTORY — DX: Other chronic pain: G89.29

## 2019-10-04 HISTORY — DX: Maternal care for unspecified type scar from previous cesarean delivery: O34.219

## 2020-04-20 ENCOUNTER — Other Ambulatory Visit: Payer: Self-pay | Admitting: Obstetrics and Gynecology

## 2020-05-03 DIAGNOSIS — Z86718 Personal history of other venous thrombosis and embolism: Secondary | ICD-10-CM

## 2020-05-03 HISTORY — DX: Personal history of other venous thrombosis and embolism: Z86.718

## 2020-05-04 ENCOUNTER — Encounter (HOSPITAL_COMMUNITY): Payer: Self-pay

## 2020-05-04 NOTE — Patient Instructions (Addendum)
Deborah Petty  05/04/2020   Your procedure is scheduled on:  05/17/2020  Arrive at [place] at Entrance C on [address] at [facility]  and [address]. You are invited to use the FREE valet parking or use the Visitor's parking deck.  Pick up the phone at the desk and dial [phone].  Call this number if you have problems the morning of surgery: [phone]  Remember:   Do not eat food:(After Midnight) Desps de medianoche.  Do not drink clear liquids: (After Midnight) Desps de medianoche.  Take these medicines the morning of surgery with A SIP OF WATER:  May take pepcid   Do not wear jewelry, make-up or nail polish.  Do not wear lotions, powders, or perfumes. Do not wear deodorant.  Do not shave 48 hours prior to surgery.  Do not bring valuables to the hospital.  [facility] is not   responsible for any belongings or valuables brought to the hospital.  Contacts, dentures or bridgework may not be worn into surgery.  Leave suitcase in the car. After surgery it may be brought to your room.  For patients admitted to the hospital, checkout time is 11:00 AM the day of              discharge.      Please read over the following fact sheets that you were given:     Preparing for Surgery

## 2020-05-05 ENCOUNTER — Encounter (HOSPITAL_COMMUNITY): Payer: Self-pay

## 2020-05-15 ENCOUNTER — Other Ambulatory Visit (HOSPITAL_COMMUNITY)
Admission: RE | Admit: 2020-05-15 | Discharge: 2020-05-15 | Disposition: A | Discharge status: Home / Self Care | Payer: Managed Care, Other (non HMO) | Source: Ambulatory Visit | Attending: Obstetrics and Gynecology | Admitting: Obstetrics and Gynecology

## 2020-05-15 ENCOUNTER — Other Ambulatory Visit: Payer: Self-pay

## 2020-05-15 DIAGNOSIS — Z20822 Contact with and (suspected) exposure to covid-19: Secondary | ICD-10-CM | POA: Insufficient documentation

## 2020-05-15 DIAGNOSIS — Z01812 Encounter for preprocedural laboratory examination: Secondary | ICD-10-CM | POA: Insufficient documentation

## 2020-05-15 HISTORY — DX: Other complications of anesthesia, initial encounter: T88.59XA

## 2020-05-15 HISTORY — DX: Depression, unspecified: F32.A

## 2020-05-15 LAB — CBC
HCT: 38.4 % (ref 36.0–46.0)
Hemoglobin: 12.5 g/dL (ref 12.0–15.0)
MCH: 28.5 pg (ref 26.0–34.0)
MCHC: 32.6 g/dL (ref 30.0–36.0)
MCV: 87.7 fL (ref 80.0–100.0)
Platelets: 200 10*3/uL (ref 150–400)
RBC: 4.38 MIL/uL (ref 3.87–5.11)
RDW: 13.8 % (ref 11.5–15.5)
WBC: 13.6 10*3/uL — ABNORMAL HIGH (ref 4.0–10.5)
nRBC: 0 % (ref 0.0–0.2)

## 2020-05-15 LAB — TYPE AND SCREEN
ABO/RH(D): A POS
Antibody Screen: NEGATIVE

## 2020-05-15 LAB — SARS CORONAVIRUS 2 (TAT 6-24 HRS): SARS Coronavirus 2: NEGATIVE

## 2020-05-15 LAB — RPR: RPR Ser Ql: NONREACTIVE

## 2020-05-15 NOTE — MAU Note (Signed)
Asymptomatic. Swab collected. Waiting on lab.

## 2020-05-16 NOTE — Anesthesia Preprocedure Evaluation (Addendum)
Anesthesia Evaluation  Patient identified by MRN, date of birth, ID band Patient awake    Reviewed: Allergy & Precautions, NPO status , Patient's Chart, lab work & pertinent test results  History of Anesthesia Complications Negative for: history of anesthetic complications  Airway Mallampati: II  TM Distance: >3 FB Neck ROM: Full    Dental  (+) Dental Advisory Given   Pulmonary neg pulmonary ROS,    Pulmonary exam normal        Cardiovascular negative cardio ROS Normal cardiovascular exam     Neuro/Psych PSYCHIATRIC DISORDERS Depression negative neurological ROS     GI/Hepatic negative GI ROS, Neg liver ROS,   Endo/Other   Obesity   Renal/GU negative Renal ROS     Musculoskeletal negative musculoskeletal ROS (+)   Abdominal   Peds  Hematology negative hematology ROS (+)  Plt 200k    Anesthesia Other Findings Covid test negative Per patient, she experienced what sounds like a high spinal with procedure in 2017, describing shortness of breath, weakness in her arms, and inability to swallow. States the team did not let her husband into the operating room because of this and she had prolonged recovery after the procedure. There is no documentation describing these events in her records to verify or obtain more details. Her records appear to show a normal anesthetic with no mention of high spinal. Mild bradycardia after spinal to 50's, during which she received some ephedrine, but not requiring treatment with atropine, glycopyrrolate, or other agents. No significant hypotension seen on the record either, patient was not intubated during the procedure and no documentation of supplemental oxygen, normal SaO2 throughout.  Reproductive/Obstetrics (+) Pregnancy                            Anesthesia Physical Anesthesia Plan  ASA: II  Anesthesia Plan: Spinal   Post-op Pain Management:     Induction:   PONV Risk Score and Plan: 2 and Treatment may vary due to age or medical condition, Ondansetron and Scopolamine patch - Pre-op  Airway Management Planned: Natural Airway  Additional Equipment: None  Intra-op Plan:   Post-operative Plan:   Informed Consent: I have reviewed the patients History and Physical, chart, labs and discussed the procedure including the risks, benefits and alternatives for the proposed anesthesia with the patient or authorized representative who has indicated his/her understanding and acceptance.       Plan Discussed with: CRNA and Anesthesiologist  Anesthesia Plan Comments: (Labs reviewed, platelets acceptable. Discussed risks and benefits of spinal, including spinal/epidural hematoma, infection, failed block, high spinal, and PDPH. Patient expressed understanding and wished to proceed. )       Anesthesia Quick Evaluation

## 2020-05-17 ENCOUNTER — Inpatient Hospital Stay (HOSPITAL_COMMUNITY): Payer: Managed Care, Other (non HMO) | Admitting: Anesthesiology

## 2020-05-17 ENCOUNTER — Other Ambulatory Visit: Payer: Self-pay

## 2020-05-17 ENCOUNTER — Inpatient Hospital Stay (HOSPITAL_COMMUNITY)
Admission: RE | Admit: 2020-05-17 | Discharge: 2020-05-19 | DRG: 788 | Disposition: A | Discharge status: Home / Self Care | Payer: Managed Care, Other (non HMO) | Attending: Obstetrics and Gynecology | Admitting: Obstetrics and Gynecology

## 2020-05-17 ENCOUNTER — Encounter (HOSPITAL_COMMUNITY): Payer: Self-pay | Admitting: Obstetrics and Gynecology

## 2020-05-17 ENCOUNTER — Encounter (HOSPITAL_COMMUNITY)
Admission: RE | Disposition: A | Discharge status: Home / Self Care | Payer: Self-pay | Source: Home / Self Care | Attending: Obstetrics and Gynecology

## 2020-05-17 DIAGNOSIS — O99214 Obesity complicating childbirth: Secondary | ICD-10-CM | POA: Diagnosis present

## 2020-05-17 DIAGNOSIS — O34219 Maternal care for unspecified type scar from previous cesarean delivery: Secondary | ICD-10-CM | POA: Diagnosis present

## 2020-05-17 DIAGNOSIS — E669 Obesity, unspecified: Secondary | ICD-10-CM | POA: Diagnosis present

## 2020-05-17 DIAGNOSIS — O34211 Maternal care for low transverse scar from previous cesarean delivery: Secondary | ICD-10-CM | POA: Diagnosis present

## 2020-05-17 DIAGNOSIS — Z3A39 39 weeks gestation of pregnancy: Secondary | ICD-10-CM | POA: Diagnosis not present

## 2020-05-17 DIAGNOSIS — Z98891 History of uterine scar from previous surgery: Secondary | ICD-10-CM

## 2020-05-17 DIAGNOSIS — Z20822 Contact with and (suspected) exposure to covid-19: Secondary | ICD-10-CM | POA: Diagnosis present

## 2020-05-17 HISTORY — PX: TUBAL LIGATION: SHX77

## 2020-05-17 HISTORY — DX: History of uterine scar from previous surgery: Z98.891

## 2020-05-17 SURGERY — Surgical Case
Anesthesia: Spinal

## 2020-05-17 MED ORDER — PRENATAL MULTIVITAMIN CH
1.0000 | ORAL_TABLET | Freq: Every day | ORAL | Status: DC
  Administered 2020-05-18 – 2020-05-19 (×2): 1 via ORAL
  Filled 2020-05-17 (×2): qty 1

## 2020-05-17 MED ORDER — TETANUS-DIPHTH-ACELL PERTUSSIS 5-2.5-18.5 LF-MCG/0.5 IM SUSP: 0.5000 mL | Freq: Once | INTRAMUSCULAR | Status: DC

## 2020-05-17 MED ORDER — KETOROLAC TROMETHAMINE 30 MG/ML IJ SOLN
30.0000 mg | Freq: Four times a day (QID) | INTRAMUSCULAR | Status: AC
  Administered 2020-05-17 – 2020-05-18 (×4): 30 mg via INTRAVENOUS
  Filled 2020-05-17 (×3): qty 1

## 2020-05-17 MED ORDER — OXYCODONE-ACETAMINOPHEN 5-325 MG PO TABS
1.0000 | ORAL_TABLET | ORAL | Status: DC | PRN
  Administered 2020-05-18 – 2020-05-19 (×5): 2 via ORAL
  Filled 2020-05-17 (×5): qty 2

## 2020-05-17 MED ORDER — SIMETHICONE 80 MG PO CHEW: 80.0000 mg | CHEWABLE_TABLET | ORAL | Status: DC | PRN

## 2020-05-17 MED ORDER — OXYCODONE HCL 5 MG PO TABS: 5.0000 mg | ORAL_TABLET | Freq: Once | ORAL | Status: DC | PRN

## 2020-05-17 MED ORDER — SODIUM CHLORIDE 0.9% FLUSH: 3.0000 mL | INTRAVENOUS | Status: DC | PRN

## 2020-05-17 MED ORDER — WITCH HAZEL-GLYCERIN EX PADS: 1.0000 "application " | MEDICATED_PAD | CUTANEOUS | Status: DC | PRN

## 2020-05-17 MED ORDER — METHYLERGONOVINE MALEATE 0.2 MG/ML IJ SOLN: 0.2000 mg | INTRAMUSCULAR | Status: DC | PRN

## 2020-05-17 MED ORDER — ONDANSETRON HCL 4 MG/2ML IJ SOLN
INTRAMUSCULAR | Status: DC | PRN
  Administered 2020-05-17: 4 mg via INTRAVENOUS

## 2020-05-17 MED ORDER — MENTHOL 3 MG MT LOZG: 1.0000 | LOZENGE | OROMUCOSAL | Status: DC | PRN

## 2020-05-17 MED ORDER — OXYCODONE HCL 5 MG/5ML PO SOLN: 5.0000 mg | Freq: Once | ORAL | Status: DC | PRN

## 2020-05-17 MED ORDER — CHLORHEXIDINE GLUCONATE 0.12 % MT SOLN: 15.0000 mL | Freq: Once | OROMUCOSAL | Status: DC

## 2020-05-17 MED ORDER — BUPIVACAINE HCL (PF) 0.25 % IJ SOLN
INTRAMUSCULAR | Status: AC
  Filled 2020-05-17: qty 30

## 2020-05-17 MED ORDER — PHENYLEPHRINE HCL-NACL 20-0.9 MG/250ML-% IV SOLN
INTRAVENOUS | Status: DC | PRN
  Administered 2020-05-17: 60 ug/min via INTRAVENOUS

## 2020-05-17 MED ORDER — ONDANSETRON HCL 4 MG/2ML IJ SOLN: 4.0000 mg | Freq: Three times a day (TID) | INTRAMUSCULAR | Status: DC | PRN

## 2020-05-17 MED ORDER — POVIDONE-IODINE 10 % EX SWAB: 2.0000 "application " | Freq: Once | CUTANEOUS | Status: DC

## 2020-05-17 MED ORDER — DIPHENHYDRAMINE HCL 50 MG/ML IJ SOLN: 12.5000 mg | INTRAMUSCULAR | Status: DC | PRN

## 2020-05-17 MED ORDER — ORAL CARE MOUTH RINSE: 15.0000 mL | Freq: Once | OROMUCOSAL | Status: DC

## 2020-05-17 MED ORDER — ONDANSETRON HCL 4 MG/2ML IJ SOLN
INTRAMUSCULAR | Status: AC
  Filled 2020-05-17: qty 2

## 2020-05-17 MED ORDER — PHENYLEPHRINE HCL-NACL 20-0.9 MG/250ML-% IV SOLN
INTRAVENOUS | Status: AC
  Filled 2020-05-17: qty 250

## 2020-05-17 MED ORDER — NALBUPHINE HCL 10 MG/ML IJ SOLN: 5.0000 mg | Freq: Once | INTRAMUSCULAR | Status: AC | PRN

## 2020-05-17 MED ORDER — DIBUCAINE (PERIANAL) 1 % EX OINT: 1.0000 "application " | TOPICAL_OINTMENT | CUTANEOUS | Status: DC | PRN

## 2020-05-17 MED ORDER — SCOPOLAMINE 1 MG/3DAYS TD PT72
1.0000 | MEDICATED_PATCH | Freq: Once | TRANSDERMAL | Status: DC
  Administered 2020-05-17: 1.5 mg via TRANSDERMAL
  Filled 2020-05-17: qty 1

## 2020-05-17 MED ORDER — KETOROLAC TROMETHAMINE 30 MG/ML IJ SOLN
INTRAMUSCULAR | Status: AC
  Filled 2020-05-17: qty 1

## 2020-05-17 MED ORDER — OXYTOCIN-SODIUM CHLORIDE 30-0.9 UT/500ML-% IV SOLN: 2.5000 [IU]/h | INTRAVENOUS | Status: AC

## 2020-05-17 MED ORDER — LACTATED RINGERS IV SOLN: INTRAVENOUS | Status: DC

## 2020-05-17 MED ORDER — DEXMEDETOMIDINE (PRECEDEX) IN NS 20 MCG/5ML (4 MCG/ML) IV SYRINGE
PREFILLED_SYRINGE | INTRAVENOUS | Status: DC | PRN
  Administered 2020-05-17: 8 ug via INTRAVENOUS

## 2020-05-17 MED ORDER — CEFAZOLIN SODIUM-DEXTROSE 2-4 GM/100ML-% IV SOLN
2.0000 g | INTRAVENOUS | Status: AC
  Administered 2020-05-17: 2 g via INTRAVENOUS

## 2020-05-17 MED ORDER — NALBUPHINE HCL 10 MG/ML IJ SOLN: 5.0000 mg | INTRAMUSCULAR | Status: DC | PRN

## 2020-05-17 MED ORDER — OXYTOCIN-SODIUM CHLORIDE 30-0.9 UT/500ML-% IV SOLN
INTRAVENOUS | Status: DC | PRN
  Administered 2020-05-17: 30 [IU] via INTRAVENOUS

## 2020-05-17 MED ORDER — MEPERIDINE HCL 25 MG/ML IJ SOLN: 6.2500 mg | INTRAMUSCULAR | Status: DC | PRN

## 2020-05-17 MED ORDER — CEFAZOLIN SODIUM-DEXTROSE 2-4 GM/100ML-% IV SOLN
INTRAVENOUS | Status: AC
  Filled 2020-05-17: qty 100

## 2020-05-17 MED ORDER — LACTATED RINGERS IV SOLN
INTRAVENOUS | Status: DC | PRN
Start: 2020-05-17 — End: 2020-05-17

## 2020-05-17 MED ORDER — KETOROLAC TROMETHAMINE 30 MG/ML IJ SOLN: 30.0000 mg | Freq: Four times a day (QID) | INTRAMUSCULAR | Status: AC | PRN

## 2020-05-17 MED ORDER — SOD CITRATE-CITRIC ACID 500-334 MG/5ML PO SOLN: 30.0000 mL | Freq: Once | ORAL | Status: DC

## 2020-05-17 MED ORDER — SIMETHICONE 80 MG PO CHEW
80.0000 mg | CHEWABLE_TABLET | ORAL | Status: DC
  Administered 2020-05-17 – 2020-05-19 (×2): 80 mg via ORAL
  Filled 2020-05-17 (×2): qty 1

## 2020-05-17 MED ORDER — DEXMEDETOMIDINE (PRECEDEX) IN NS 20 MCG/5ML (4 MCG/ML) IV SYRINGE
PREFILLED_SYRINGE | INTRAVENOUS | Status: AC
  Filled 2020-05-17: qty 5

## 2020-05-17 MED ORDER — NALBUPHINE HCL 10 MG/ML IJ SOLN
5.0000 mg | Freq: Once | INTRAMUSCULAR | Status: AC | PRN
  Administered 2020-05-17: 5 mg via SUBCUTANEOUS

## 2020-05-17 MED ORDER — COCONUT OIL OIL: 1.0000 "application " | TOPICAL_OIL | Status: DC | PRN

## 2020-05-17 MED ORDER — MORPHINE SULFATE (PF) 0.5 MG/ML IJ SOLN
INTRAMUSCULAR | Status: AC
  Filled 2020-05-17: qty 10

## 2020-05-17 MED ORDER — FENTANYL CITRATE (PF) 100 MCG/2ML IJ SOLN: 25.0000 ug | INTRAMUSCULAR | Status: DC | PRN

## 2020-05-17 MED ORDER — BUPIVACAINE IN DEXTROSE 0.75-8.25 % IT SOLN
INTRATHECAL | Status: DC | PRN
  Administered 2020-05-17: 1.5 mg via INTRATHECAL

## 2020-05-17 MED ORDER — METHYLERGONOVINE MALEATE 0.2 MG PO TABS: 0.2000 mg | ORAL_TABLET | ORAL | Status: DC | PRN

## 2020-05-17 MED ORDER — DEXAMETHASONE SODIUM PHOSPHATE 10 MG/ML IJ SOLN
INTRAMUSCULAR | Status: AC
  Filled 2020-05-17: qty 1

## 2020-05-17 MED ORDER — FENTANYL CITRATE (PF) 100 MCG/2ML IJ SOLN
INTRAMUSCULAR | Status: AC
  Filled 2020-05-17: qty 2

## 2020-05-17 MED ORDER — NALOXONE HCL 4 MG/10ML IJ SOLN
1.0000 ug/kg/h | INTRAVENOUS | Status: DC | PRN
  Filled 2020-05-17: qty 5

## 2020-05-17 MED ORDER — MORPHINE SULFATE (PF) 0.5 MG/ML IJ SOLN
INTRAMUSCULAR | Status: DC | PRN
  Administered 2020-05-17: .15 mg via INTRATHECAL

## 2020-05-17 MED ORDER — IBUPROFEN 800 MG PO TABS
800.0000 mg | ORAL_TABLET | Freq: Four times a day (QID) | ORAL | Status: DC
  Administered 2020-05-18 – 2020-05-19 (×5): 800 mg via ORAL
  Filled 2020-05-17 (×5): qty 1

## 2020-05-17 MED ORDER — NALBUPHINE HCL 10 MG/ML IJ SOLN
5.0000 mg | INTRAMUSCULAR | Status: DC | PRN
  Administered 2020-05-18 (×2): 5 mg via INTRAVENOUS
  Filled 2020-05-17 (×2): qty 1

## 2020-05-17 MED ORDER — SCOPOLAMINE 1 MG/3DAYS TD PT72: 1.0000 | MEDICATED_PATCH | Freq: Once | TRANSDERMAL | Status: DC

## 2020-05-17 MED ORDER — BUPIVACAINE HCL (PF) 0.25 % IJ SOLN
INTRAMUSCULAR | Status: DC | PRN
  Administered 2020-05-17: 20 mL

## 2020-05-17 MED ORDER — ZOLPIDEM TARTRATE 5 MG PO TABS: 5.0000 mg | ORAL_TABLET | Freq: Every evening | ORAL | Status: DC | PRN

## 2020-05-17 MED ORDER — FAMOTIDINE 20 MG PO TABS: 20.0000 mg | ORAL_TABLET | Freq: Once | ORAL | Status: DC

## 2020-05-17 MED ORDER — SENNOSIDES-DOCUSATE SODIUM 8.6-50 MG PO TABS
2.0000 | ORAL_TABLET | ORAL | Status: DC
  Administered 2020-05-17 – 2020-05-18 (×2): 2 via ORAL
  Filled 2020-05-17 (×2): qty 2

## 2020-05-17 MED ORDER — NALOXONE HCL 0.4 MG/ML IJ SOLN: 0.4000 mg | INTRAMUSCULAR | Status: DC | PRN

## 2020-05-17 MED ORDER — OXYTOCIN-SODIUM CHLORIDE 30-0.9 UT/500ML-% IV SOLN
INTRAVENOUS | Status: AC
  Filled 2020-05-17: qty 500

## 2020-05-17 MED ORDER — PROMETHAZINE HCL 25 MG/ML IJ SOLN: 6.2500 mg | INTRAMUSCULAR | Status: DC | PRN

## 2020-05-17 MED ORDER — DEXAMETHASONE SODIUM PHOSPHATE 4 MG/ML IJ SOLN
INTRAMUSCULAR | Status: DC | PRN
  Administered 2020-05-17: 8 mg via INTRAVENOUS

## 2020-05-17 MED ORDER — DIPHENHYDRAMINE HCL 25 MG PO CAPS: 25.0000 mg | ORAL_CAPSULE | Freq: Four times a day (QID) | ORAL | Status: DC | PRN

## 2020-05-17 MED ORDER — DIPHENHYDRAMINE HCL 25 MG PO CAPS
25.0000 mg | ORAL_CAPSULE | ORAL | Status: DC | PRN
  Administered 2020-05-18: 25 mg via ORAL
  Filled 2020-05-17: qty 1

## 2020-05-17 MED ORDER — NALBUPHINE HCL 10 MG/ML IJ SOLN
INTRAMUSCULAR | Status: AC
  Filled 2020-05-17: qty 1

## 2020-05-17 MED ORDER — FENTANYL CITRATE (PF) 100 MCG/2ML IJ SOLN
INTRAMUSCULAR | Status: DC | PRN
  Administered 2020-05-17: 15 ug via INTRATHECAL

## 2020-05-17 MED ORDER — SIMETHICONE 80 MG PO CHEW
80.0000 mg | CHEWABLE_TABLET | Freq: Three times a day (TID) | ORAL | Status: DC
  Administered 2020-05-17 – 2020-05-19 (×5): 80 mg via ORAL
  Filled 2020-05-17 (×5): qty 1

## 2020-05-17 SURGICAL SUPPLY — 36 items
BENZOIN TINCTURE PRP APPL 2/3 (GAUZE/BANDAGES/DRESSINGS) ×3 IMPLANT
CHLORAPREP W/TINT 26ML (MISCELLANEOUS) ×3 IMPLANT
CLAMP CORD UMBIL (MISCELLANEOUS) IMPLANT
CLOSURE WOUND 1/2 X4 (GAUZE/BANDAGES/DRESSINGS) ×1
CLOTH BEACON ORANGE TIMEOUT ST (SAFETY) ×3 IMPLANT
DRSG OPSITE POSTOP 4X10 (GAUZE/BANDAGES/DRESSINGS) ×3 IMPLANT
ELECT REM PT RETURN 9FT ADLT (ELECTROSURGICAL) ×3
ELECTRODE REM PT RTRN 9FT ADLT (ELECTROSURGICAL) ×1 IMPLANT
EXTRACTOR VACUUM M CUP 4 TUBE (SUCTIONS) IMPLANT
EXTRACTOR VACUUM M CUP 4' TUBE (SUCTIONS)
GLOVE BIO SURGEON STRL SZ7.5 (GLOVE) ×3 IMPLANT
GLOVE BIOGEL PI IND STRL 7.0 (GLOVE) ×1 IMPLANT
GLOVE BIOGEL PI INDICATOR 7.0 (GLOVE) ×2
GOWN STRL REUS W/TWL LRG LVL3 (GOWN DISPOSABLE) ×6 IMPLANT
KIT ABG SYR 3ML LUER SLIP (SYRINGE) IMPLANT
NEEDLE HYPO 22GX1.5 SAFETY (NEEDLE) ×3 IMPLANT
NEEDLE HYPO 25X5/8 SAFETYGLIDE (NEEDLE) IMPLANT
NEEDLE SPNL 20GX3.5 QUINCKE YW (NEEDLE) IMPLANT
NS IRRIG 1000ML POUR BTL (IV SOLUTION) ×3 IMPLANT
PACK C SECTION WH (CUSTOM PROCEDURE TRAY) ×3 IMPLANT
PENCIL SMOKE EVAC W/HOLSTER (ELECTROSURGICAL) ×3 IMPLANT
STRIP CLOSURE SKIN 1/2X4 (GAUZE/BANDAGES/DRESSINGS) ×2 IMPLANT
SUT MNCRL 0 VIOLET CTX 36 (SUTURE) ×2 IMPLANT
SUT MNCRL AB 3-0 PS2 27 (SUTURE) IMPLANT
SUT MON AB 2-0 CT1 27 (SUTURE) ×3 IMPLANT
SUT MON AB-0 CT1 36 (SUTURE) ×6 IMPLANT
SUT MONOCRYL 0 CTX 36 (SUTURE) ×6
SUT PLAIN 0 NONE (SUTURE) IMPLANT
SUT PLAIN 2 0 (SUTURE)
SUT PLAIN 2 0 XLH (SUTURE) ×3 IMPLANT
SUT PLAIN ABS 2-0 CT1 27XMFL (SUTURE) IMPLANT
SYR 20CC LL (SYRINGE) IMPLANT
SYR CONTROL 10ML LL (SYRINGE) ×3 IMPLANT
TOWEL OR 17X24 6PK STRL BLUE (TOWEL DISPOSABLE) ×3 IMPLANT
TRAY FOLEY W/BAG SLVR 14FR LF (SET/KITS/TRAYS/PACK) ×3 IMPLANT
WATER STERILE IRR 1000ML POUR (IV SOLUTION) ×3 IMPLANT

## 2020-05-17 NOTE — Lactation Note (Signed)
This note was copied from a baby's chart. Lactation Consultation Note  Patient Name: Boy Roshanna Woodhead Today's Date: 05/17/2020 Reason for consult: Initial assessment;Mother's request  Telephone call from NP reporting that mom cant get baby latched onto left breast. NP reports mom had a bad delivery last time and milk never came to volume. Mom did both breastfeeding/formula feeding and feeding breastmilk from a bottle for 7 months.  Asked mom if I could assist with latching to left breast. Mom agreed.  LC noticed that mom has somewhat wide spaced breasts and less rounded breasts.  [place] asked mom about adding some pumping, massage, and hand expression to breastfeeding to maximize her milk production.  Mom reports she felt breasts changes during pregnancy. Mom reports that's how she knew she was pregnant that her breasts were very tender.  Assist with latching on left breast in somewhat laid back breastfeeding position.  After many attempts infant finally latched.  He would take about five to 8 quick sucks and Just hold it in his mouth.  This was the pattern until he fell asleep.  Left STS with mom.  Reviewed [facility] Breastfeeding services consultation handout.  Urged mom to call lactation as needed.

## 2020-05-17 NOTE — Op Note (Signed)
Cesarean Section Procedure Note  Indications: previous uterine incision Kerr x one  Elective TL  Pre-operative Diagnosis: 39 week 1 day pregnancy.  Post-operative Diagnosis: same  Surgeon: Lovenia Kim   Assistants: Pamala Hurry, MD  Anesthesia: Local anesthesia 0.25.% bupivacaine and Spinal anesthesia  ASA Class: 2  Procedure Details  The patient was seen in the Holding Room. The risks, benefits, complications, treatment options, and expected outcomes were discussed with the patient.  The patient concurred with the proposed plan, giving informed consent. The risks of anesthesia, infection, bleeding and possible injury to other organs discussed. Injury to bowel, bladder, or ureter with possible need for repair discussed. Possible need for transfusion with secondary risks of hepatitis or HIV acquisition discussed. Post operative complications to include but not limited to DVT, PE and Pneumonia noted. The site of surgery properly noted/marked. The patient was taken to Operating Room # B, identified as Ashtan Griego and the procedure verified as C-Section Delivery. A Time Out was held and the above information confirmed.  After induction of anesthesia, the patient was draped and prepped in the usual sterile manner. A Pfannenstiel incision was made and carried down through the subcutaneous tissue to the fascia. Fascial incision was made and extended transversely using Mayo scissors. The fascia was separated from the underlying rectus tissue superiorly and inferiorly. The peritoneum was identified and entered. Peritoneal incision was extended longitudinally. The utero-vesical peritoneal reflection was incised transversely and the bladder flap was bluntly freed from the lower uterine segment. A low transverse uterine incision(Kerr hysterotomy) was made. Delivered from OA position was a female and handed off to Peds for evaluation. The placenta was removed intact and appeared normal. The uterus was  curetted with a dry lap pack. Good hemostasis was noted.The uterine outline, tubes and ovaries appeared normal. The uterine incision was closed with running locked sutures of 0 Monocryl x 1 layer. Hemostasis was observed. Lavage was carried out until clear. Modified bilateral Pomeroy partial salpingectomy done withour complications. The parietal peritoneum was closed with a running 2-0 Monocryl suture. The fascia was then reapproximated with running sutures of 0 Monocryl. The skin was reapproximated with 3-0 monocryl.  Instrument, sponge, and needle counts were correct prior the abdominal closure and at the conclusion of the case.   Findings: OA, female, post placenta, nl adnexa  Estimated Blood Loss:  300 mL         Drains: foley                 Specimens: placenta and bilateral tubal segments                 Complications:  None; patient tolerated the procedure well.         Disposition: PACU - hemodynamically stable.         Condition: stable  Attending Attestation: I performed the procedure.

## 2020-05-17 NOTE — Transfer of Care (Signed)
Immediate Anesthesia Transfer of Care Note  Patient: Deborah Petty  Procedure(s) Performed: Repeat CESAREAN SECTION (N/A )  Patient Location: PACU  Anesthesia Type:Spinal  Level of Consciousness: awake, alert , oriented and patient cooperative  Airway & Oxygen Therapy: Patient Spontanous Breathing  Post-op Assessment: Report given to RN, Post -op Vital signs reviewed and stable and Patient moving all extremities X 4  Post vital signs: Reviewed and stable  Last Vitals:  Vitals Value Taken Time  BP    Temp    Pulse 87 05/17/20 1305  Resp    SpO2 99 % 05/17/20 1305  Vitals shown include unvalidated device data.  Last Pain:  Vitals:   05/17/20 1044  TempSrc: Oral         Complications: No complications documented.

## 2020-05-17 NOTE — Anesthesia Procedure Notes (Signed)
Spinal  Patient location during procedure: OR Start time: 05/17/2020 11:55 AM End time: 05/17/2020 11:59 AM Staffing Performed: anesthesiologist  Anesthesiologist: Audry Pili, MD Preanesthetic Checklist Completed: patient identified, IV checked, risks and benefits discussed, surgical consent, monitors and equipment checked, pre-op evaluation and timeout performed Spinal Block Patient position: sitting Prep: DuraPrep Patient monitoring: heart rate, cardiac monitor, continuous pulse ox and blood pressure Approach: midline Location: L3-4 Injection technique: single-shot Needle Needle type: Pencan  Needle gauge: 24 G Additional Notes Consent was obtained prior to the procedure with all questions answered and concerns addressed. Risks including, but not limited to, bleeding, infection, nerve damage, paralysis, failed block, inadequate analgesia, allergic reaction, high spinal, itching, and headache were discussed and the patient wished to proceed. Functioning IV was confirmed and monitors were applied. Sterile prep and drape, including hand hygiene, mask, and sterile gloves were used. The patient was positioned and the spine was prepped. The skin was anesthetized with lidocaine. Free flow of clear CSF was obtained prior to injecting local anesthetic into the CSF. The spinal needle aspirated freely following injection. The needle was carefully withdrawn. The patient tolerated the procedure well.   Renold Don, MD

## 2020-05-17 NOTE — H&P (Addendum)
Deborah Petty is a 39 y.o. female presenting for rpt csection. OB History    Gravida  3   Para  1   Term  1   Preterm  0   AB  1   Living  1     SAB  1   TAB  0   Ectopic  0   Multiple  0   Live Births  1          Past Medical History:  Diagnosis Date  . Breech presentation 06/07/2016  . Complication of anesthesia    review chart from last CS.  Level went high couldn't see,   . Depression    post partum meds taken  . Medical history non-contributory   . Postpartum care following cesarean delivery (9/5) 06/07/2016   Past Surgical History:  Procedure Laterality Date  . BACK SURGERY     lipoma removal  . CESAREAN SECTION N/A 06/07/2016   Procedure: PRIMARY CESAREAN SECTION - BREECH PRESENTATION;  Surgeon: Brien Few, MD;  Location: [place];  Service: Obstetrics;  Laterality: N/A;  EDD 06/07/2016  . TONSILLECTOMY     Family History: family history includes Diabetes in her maternal grandmother and paternal grandfather; Heart attack in her maternal grandmother; Heart disease in her father and paternal grandfather; Hyperlipidemia in her father. Social History:  reports that she has never smoked. She has never used smokeless tobacco. She reports current alcohol use. She reports that she does not use drugs.     Maternal Diabetes: No Genetic Screening: Normal Maternal Ultrasounds/Referrals: Normal Fetal Ultrasounds or other Referrals:  None Maternal Substance Abuse:  No Significant Maternal Medications:  None Significant Maternal Lab Results:  Group B Strep negative Other Comments:  None  Review of Systems  Constitutional: Negative.   All other systems reviewed and are negative.  Maternal Medical History:  Reason for admission: Contractions.   Contractions: Onset was more than 2 days ago.   Frequency: irregular.   Perceived severity is mild.    Fetal activity: Perceived fetal activity is normal.   Last perceived fetal movement was within the past  hour.    Prenatal complications: Polyhydramnios.   Prenatal Complications - Diabetes: none.      unknown if currently breastfeeding. Maternal Exam:  Uterine Assessment: Contraction strength is mild.  Contraction frequency is irregular.   Abdomen: Patient reports no abdominal tenderness. Surgical scars: low transverse.   Fetal presentation: vertex  Introitus: Normal vulva. Normal vagina.  Ferning test: not done.  Nitrazine test: not done. Amniotic fluid character: not assessed.  Cervix: Cervix evaluated by digital exam.     Physical Exam Vitals and nursing note reviewed.  Constitutional:      Appearance: Normal appearance.  HENT:     Head: Normocephalic and atraumatic.  Cardiovascular:     Rate and Rhythm: Normal rate and regular rhythm.     Heart sounds: Normal heart sounds.  Pulmonary:     Effort: Pulmonary effort is normal.     Breath sounds: Normal breath sounds.  Abdominal:     Palpations: Abdomen is soft.  Genitourinary:    General: Normal vulva.  Musculoskeletal:        General: Normal range of motion.     Cervical back: Normal range of motion and neck supple.  Skin:    General: Skin is warm and dry.  Neurological:     General: No focal deficit present.     Mental Status: She is oriented to person, place,  and time.  Psychiatric:        Mood and Affect: Mood normal.        Behavior: Behavior normal.     Prenatal labs: ABO, Rh: --/--/A POS (08/13 0818) Antibody: NEG (08/13 0818) Rubella:   RPR: NON REACTIVE (08/13 0818)  HBsAg:   neg HIV:   neg GBS:   neg  Assessment/Plan: 39 wk IUP Previous csection for breech Declines TOLAC History of PP depression. Will likely need meds PP Rpt csection and Tubal ligation. Surgical risks discussed. Consent done.  Failure risks of TL noted.   Deborah Petty 05/17/2020, 9:24 AM

## 2020-05-17 NOTE — Anesthesia Postprocedure Evaluation (Signed)
Anesthesia Post Note  Patient: Deborah Petty  Procedure(s) Performed: Repeat CESAREAN SECTION (N/A )     Patient location during evaluation: PACU Anesthesia Type: Spinal Level of consciousness: awake and alert Pain management: pain level controlled Vital Signs Assessment: post-procedure vital signs reviewed and stable Respiratory status: spontaneous breathing and respiratory function stable Cardiovascular status: blood pressure returned to baseline and stable Postop Assessment: spinal receding and no apparent nausea or vomiting Anesthetic complications: no   No complications documented.  Last Vitals:  Vitals:   05/17/20 1401 05/17/20 1415  BP: 98/67 100/73  Pulse: 70 74  Resp: 16 15  Temp:    SpO2: 100% 98%    Last Pain:  Vitals:   05/17/20 1333  TempSrc:   PainSc: 0-No pain   Pain Goal:    LLE Motor Response: Purposeful movement (05/17/20 1415) LLE Sensation: Decreased (05/17/20 1415) RLE Motor Response: Purposeful movement (05/17/20 1415) RLE Sensation: Decreased (05/17/20 1415)        Audry Pili

## 2020-05-18 DIAGNOSIS — Z98891 History of uterine scar from previous surgery: Secondary | ICD-10-CM

## 2020-05-18 HISTORY — DX: History of uterine scar from previous surgery: Z98.891

## 2020-05-18 LAB — CBC
HCT: 33.4 % — ABNORMAL LOW (ref 36.0–46.0)
Hemoglobin: 10.6 g/dL — ABNORMAL LOW (ref 12.0–15.0)
MCH: 28.3 pg (ref 26.0–34.0)
MCHC: 31.7 g/dL (ref 30.0–36.0)
MCV: 89.1 fL (ref 80.0–100.0)
Platelets: 188 10*3/uL (ref 150–400)
RBC: 3.75 MIL/uL — ABNORMAL LOW (ref 3.87–5.11)
RDW: 14 % (ref 11.5–15.5)
WBC: 17.6 10*3/uL — ABNORMAL HIGH (ref 4.0–10.5)
nRBC: 0 % (ref 0.0–0.2)

## 2020-05-18 LAB — BIRTH TISSUE RECOVERY COLLECTION (PLACENTA DONATION)

## 2020-05-18 NOTE — Progress Notes (Signed)
Subjective: POD# 1 Live born female  Birth Weight: 8 lb 12.4 oz (3980 g) APGAR: 9, 10  Newborn Delivery   Birth date/time: 05/17/2020 12:18:00 Delivery type: C-Section, Low Transverse Trial of labor: No C-section categorization: Repeat     Baby name: Izola Price Delivering provider: Brien Few   Circumcision: Yes, planning Feeding: breast  Pain control at delivery: Spinal   Reports feeling well. States this is much better than her first experience. States she has minimal pain and been ambulating independently in the room.   Patient reports tolerating PO.   Breast symptoms:None Pain controlled with acetaminophen and ibuprofen (OTC) Denies HA/SOB/C/P/N/V/dizziness. Flatus yes. She reports vaginal bleeding as normal, without clots.  She is ambulating, urinating without difficulty.     Objective:   VS:    Vitals:   05/18/20 0315 05/18/20 0739 05/18/20 1352 05/18/20 1354  BP: (!) 96/59 (!) 88/58 (!) 82/50 (!) 85/49  Pulse: 61 62 65 65  Resp: 18 19  16   Temp: 98.1 F (36.7 C) 97.7 F (36.5 C)  99.2 F (37.3 C)  TempSrc: Oral   Oral  SpO2: 99% 100%       Intake/Output Summary (Last 24 hours) at 05/18/2020 1409 Last data filed at 05/18/2020 0700 Gross per 24 hour  Intake 2554.03 ml  Output 1950 ml  Net 604.03 ml      Recent Labs    05/18/20 0744  WBC 17.6*  HGB 10.6*  HCT 33.4*  PLT 188    Blood type: --/--/A POS (08/13 0818)  Rubella:   Immune Vaccines: TDaP UTD         Flu    Declined   Physical Exam:  General: alert and cooperative CV: Regular rate and rhythm Resp: clear Abdomen: soft, nontender, normal bowel sounds Incision: bloody drainage present, order placed to have nursing change dressing Uterine Fundus: firm, below umbilicus, nontender Lochia: moderate Ext: extremities normal, atraumatic, no cyanosis or edema  Assessment/Plan: 39 y.o.   POD# 1. [id number]                  Principal Problem:   Postpartum care following cesarean delivery  8/15 Active Problems:   Previous cesarean delivery affecting pregnancy   Previous cesarean section   Status post repeat low transverse cesarean section 8/15  Doing well, stable.    Will have nursing change dressing           Advance diet as tolerated Encourage rest when baby rests Breastfeeding support prn Encourage to ambulate Routine post-op care Anticipate discharge tomorrow  Zettie Pho, MSN 05/18/2020, 2:09 PM

## 2020-05-18 NOTE — Progress Notes (Signed)
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  CSW met with MOB to offer support and complete assessment.    MOB resting in bed with FOB present at bedside and infant asleep in bassinet, when CSW entered the room. CSW introduced self and received verbal permission from MOB to complete assessment with FOB present. 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 MOB stated it wasn't until she was 10 months postpartum that she reached out for help. MOB reported being started on Zoloft at the time but eventually being switched to Paxil and Welbutrin. Per MOB, medications were effective but they discontinued them when they decided they wanted to get pregnant again. MOB aware of signs and symptoms to be on the look out for and has a good relationship with doctor in the event she feels she wants to be restarted on medications. CSW provided education regarding the baby blues period vs. perinatal mood disorders, discussed treatment and gave resources for mental health follow up if concerns arise.  CSW recommends self-evaluation during the postpartum time period using the New Mom Checklist from Postpartum Progress and encouraged MOB to contact a medical professional if symptoms are noted at any time. MOB denied any current SI or HI and reported having a good support system.  MOB confirmed having all essential items for infant once discharged and stated infant would be sleeping in a bassinet once home. CSW provided review of Sudden Infant Death Syndrome (SIDS) precautions and safe sleeping habits.    CSW identifies no further need for intervention and no barriers to discharge at this time.  Deborah Rotundo, LCSW [facility] [phone]  

## 2020-05-18 NOTE — Lactation Note (Signed)
This note was copied from a baby's chart. Lactation Consultation Note  Patient Name: Deborah Petty Today's Date: 05/18/2020 Reason for consult: MD order;Follow-up assessment  Following up per NP request with 11 hours old baby Deborah with 1.63% weight loss of a P2 mother with some breastfeeding experience. Mother reports breastfeeding and supplemented with formula at each feeding. Mother explained last feeding baby took ~15 mL of formula via bottle. Parents state their main goal is to exclusively breastfeed this baby but they will use formula as needed. Baby had eight stools, several voids and a couple emesis. Discussed supplementation guidelines, stomach size and paced bottle feeding. Encouraged frequent burping and sit up position when supplementing with bottle. Encouraged to contact lactation services as needed for any questions or concerns.   Baby is sleeping in basinet because had a circumcision recently.     Maternal Data Formula Feeding for Exclusion: No  Interventions Interventions: Breast feeding basics reviewed, paced bottle feeding encouraged.   Consult Status Consult Status: Follow-up Date: 05/19/20 Follow-up type: In-patient    Zonya Gudger A Higuera Ancidey 05/18/2020, 2:35 PM

## 2020-05-19 ENCOUNTER — Encounter (HOSPITAL_COMMUNITY): Payer: Self-pay | Admitting: Obstetrics and Gynecology

## 2020-05-19 LAB — SURGICAL PATHOLOGY

## 2020-05-19 MED ORDER — IBUPROFEN 800 MG PO TABS: 800.0000 mg | ORAL_TABLET | Freq: Four times a day (QID) | ORAL | 0 refills | Status: DC

## 2020-05-19 MED ORDER — COCONUT OIL OIL: 1.0000 "application " | TOPICAL_OIL | 0 refills | Status: DC | PRN

## 2020-05-19 MED ORDER — SENNOSIDES-DOCUSATE SODIUM 8.6-50 MG PO TABS: 2.0000 | ORAL_TABLET | ORAL | Status: DC

## 2020-05-19 MED ORDER — OXYCODONE HCL 5 MG PO TABS: 5.0000 mg | ORAL_TABLET | ORAL | 0 refills | Status: DC | PRN

## 2020-05-19 MED ORDER — OXYCODONE HCL 5 MG PO TABS: 5.0000 mg | ORAL_TABLET | ORAL | Status: DC | PRN

## 2020-05-19 MED ORDER — SIMETHICONE 80 MG PO CHEW: 80.0000 mg | CHEWABLE_TABLET | ORAL | 0 refills | Status: DC | PRN

## 2020-05-19 MED ORDER — ACETAMINOPHEN 500 MG PO TABS
1000.0000 mg | ORAL_TABLET | Freq: Four times a day (QID) | ORAL | 2 refills | Status: DC | PRN
Start: 2020-05-19 — End: 2020-12-31

## 2020-05-19 NOTE — Discharge Instructions (Signed)
Lactation outpatient support - home visit   Scherry Ran RN, MHA, IBCLC at [employer]: Lactation Consultant  [email or web address]

## 2020-05-19 NOTE — Discharge Summary (Addendum)
OB Discharge Summary  Patient Name: Deborah Petty DOB: 05-30-1981 MRN: [id number]  Date of admission: 05/17/2020 Delivering provider: Brien Few   Admitting diagnosis: Previous cesarean delivery affecting pregnancy [O34.219] Previous cesarean section [Z98.891] Intrauterine pregnancy: [redacted]w[redacted]d     Secondary diagnosis: Patient Active Problem List   Diagnosis Date Noted  . Status post repeat low transverse cesarean section 8/15 05/18/2020  . Postpartum care following cesarean delivery 8/15 05/18/2020  . Previous cesarean delivery affecting pregnancy 05/17/2020  . Previous cesarean section 05/17/2020   Additional problems:none   Date of discharge: 05/19/2020   Discharge diagnosis: Principal Problem:   Postpartum care following cesarean delivery 8/15 Active Problems:   Previous cesarean delivery affecting pregnancy   Previous cesarean section   Status post repeat low transverse cesarean section 8/15                                                              Post partum procedures:none  Augmentation: N/A Pain control: Spinal  Laceration:None  Episiotomy:None  Complications: None  Hospital course:  Sceduled C/S   39 y.o. yo [id number] at [redacted]w[redacted]d was admitted to the hospital 05/17/2020 for scheduled cesarean section with the following indication:Elective Repeat.Delivery details are as follows:  Membrane Rupture Time/Date: 12:18 PM ,05/17/2020   Delivery Method:C-Section, Low Transverse  Details of operation can be found in separate operative note.  Patient had an uncomplicated postpartum course.  She is ambulating, tolerating a regular diet, passing flatus, and urinating well. Patient is discharged home in stable condition on  05/19/20        Newborn Data: Birth date:05/17/2020  Birth time:12:18 PM  Gender:Female  Living status:Living  Apgars:9 ,10  Weight:3980 g     Physical exam  Vitals:   05/18/20 1352 05/18/20 1354 05/18/20 2230 05/19/20 0617  BP: (!) 82/50 (!) 85/49 (!)  96/55 94/60  Pulse: 65 65 66 67  Resp:  16 16 18   Temp:  99.2 F (37.3 C) 98.3 F (36.8 C) 98.1 F (36.7 C)  TempSrc:  Oral Oral Oral  SpO2:       General: alert, cooperative and no distress Lochia: appropriate Uterine Fundus: firm Incision: Dressing is clean, dry, and intact DVT Evaluation: No cords or calf tenderness. No significant calf/ankle edema. Labs: Lab Results  Component Value Date   WBC 17.6 (H) 05/18/2020   HGB 10.6 (L) 05/18/2020   HCT 33.4 (L) 05/18/2020   MCV 89.1 05/18/2020   PLT 188 05/18/2020   No flowsheet data found. Edinburgh Postnatal Depression Scale Screening Tool 05/18/2020 05/17/2020  I have been able to laugh and see the funny side of things. 0 (No Data)  I have looked forward with enjoyment to things. 1 -  I have blamed myself unnecessarily when things went wrong. 2 -  I have been anxious or worried for no good reason. 2 -  I have felt scared or panicky for no good reason. 1 -  Things have been getting on top of me. 2 -  I have been so unhappy that I have had difficulty sleeping. 1 -  I have felt sad or miserable. 1 -  I have been so unhappy that I have been crying. 1 -  The thought of harming myself has occurred to me. 0 -  Edinburgh Postnatal  Depression Scale Total 11 -   Vaccines: TDaP UTD         Flu    declined  Discharge instruction:  per After Visit Summary,  [facility] OB booklet and  "Understanding Mother & Baby Care" hospital booklet  After Visit Meds:  Allergies as of 05/19/2020   No Known Allergies     Medication List    TAKE these medications   acetaminophen 500 MG tablet Commonly known as: TYLENOL Take 2 tablets (1,000 mg total) by mouth every 6 (six) hours as needed. What changed:   medication strength  how much to take  reasons to take this   coconut oil Oil Apply 1 application topically as needed.   famotidine 20 MG tablet Commonly known as: PEPCID Take 20 mg by mouth 2 (two) times daily.   ibuprofen 800  MG tablet Commonly known as: ADVIL Take 1 tablet (800 mg total) by mouth every 6 (six) hours.   oxyCODONE 5 MG immediate release tablet Commonly known as: Oxy IR/ROXICODONE Take 1 tablet (5 mg total) by mouth every 4 (four) hours as needed for severe pain.   prenatal multivitamin Tabs tablet Take 1 tablet by mouth at bedtime.   senna-docusate 8.6-50 MG tablet Commonly known as: Senokot-S Take 2 tablets by mouth daily. Start taking on: May 20, 2020   simethicone 80 MG chewable tablet Commonly known as: MYLICON Chew 1 tablet (80 mg total) by mouth as needed for flatulence.       Diet: routine diet  Activity: Advance as tolerated. Pelvic rest for 6 weeks.   Postpartum contraception: Not Discussed  Newborn Data: Live born female  Birth Weight: 8 lb 12.4 oz (3980 g) APGAR: 50, 10  Newborn Delivery   Birth date/time: 05/17/2020 12:18:00 Delivery type: C-Section, Low Transverse Trial of labor: No C-section categorization: Repeat      named Izola Price Baby Feeding: Breast Disposition:home with mother   Delivery Report:   Review the Delivery Report for details.    Follow up:  Follow-up Information    Brien Few, MD. Go in 6 week(s).   Specialty: Obstetrics and Gynecology Contact information: [address] [address] [phone]                 Signed: Otilio Carpen, MSN 05/19/2020, 12:02 PM

## 2020-05-19 NOTE — Lactation Note (Signed)
This note was copied from a baby's chart. Lactation Consultation Note  Patient Name: Boy Letha Mervin Today's Date: 05/19/2020 Reason for consult: Follow-up assessment   P2, Baby 35 hours old and sleeping. Mother states she is breastfeeding first and supplementing with formula. She states breastfeeding is going well and denies concerns. Feed on demand with cues.  Goal 8-12+ times per day after first 24 hrs.  Place baby STS if not cueing.  Reviewed engorgement care and monitoring voids/stools.    Maternal Data    Feeding Feeding Type: Formula  LATCH Score Latch: Grasps breast easily, tongue down, lips flanged, rhythmical sucking.  Audible Swallowing: A few with stimulation  Type of Nipple: Everted at rest and after stimulation  Comfort (Breast/Nipple): Soft / non-tender  Hold (Positioning): No assistance needed to correctly position infant at breast.  LATCH Score: 9  Interventions Interventions: Breast feeding basics reviewed  Lactation Tools Discussed/Used     Consult Status Consult Status: Complete Date: 05/19/20    Vivianne Master [facility] 05/19/2020, 11:38 AM

## 2020-10-03 DIAGNOSIS — I493 Ventricular premature depolarization: Secondary | ICD-10-CM

## 2020-10-03 HISTORY — DX: Ventricular premature depolarization: I49.3

## 2020-12-31 ENCOUNTER — Ambulatory Visit (INDEPENDENT_AMBULATORY_CARE_PROVIDER_SITE_OTHER): Payer: Managed Care, Other (non HMO) | Admitting: Internal Medicine

## 2020-12-31 ENCOUNTER — Encounter: Payer: Self-pay | Admitting: Cardiology

## 2020-12-31 ENCOUNTER — Encounter: Payer: Self-pay | Admitting: *Deleted

## 2020-12-31 ENCOUNTER — Other Ambulatory Visit: Payer: Self-pay

## 2020-12-31 VITALS — BP 110/72 | HR 82 | Ht 65.0 in | Wt 162.6 lb

## 2020-12-31 DIAGNOSIS — L659 Nonscarring hair loss, unspecified: Secondary | ICD-10-CM

## 2020-12-31 DIAGNOSIS — E041 Nontoxic single thyroid nodule: Secondary | ICD-10-CM

## 2020-12-31 NOTE — Patient Instructions (Addendum)
Please stop Biotin and come back for labs in ~2 weeks.  Please return in 1 year.   Thyroid Nodule  A thyroid nodule is an isolated growth of thyroid cells that forms a lump in your thyroid gland. The thyroid gland is a butterfly-shaped gland. It is found in the lower front of your neck. This gland sends chemical messengers (hormones) through your blood to all parts of your body. These hormones are important in regulating your body temperature and helping your body to use energy. Thyroid nodules are common. Most are not cancerous (benign). You may have one nodule or several nodules. Different types of thyroid nodules include nodules that:  Grow and fill with fluid (thyroid cysts).  Produce too much thyroid hormone (hot nodules or hyperthyroid).  Produce no thyroid hormone (cold nodules or hypothyroid).  Form from cancer cells (thyroid cancers). What are the causes? In most cases, the cause of this condition is not known. What increases the risk? The following factors may make you more likely to develop this condition.  Age. Thyroid nodules become more common in people who are older than 38 years of age.  Gender. ? Benign thyroid nodules are more common in women. ? Cancerous (malignant) thyroid nodules are more common in men.  A family history that includes: ? Thyroid nodules. ? Pheochromocytoma. ? Thyroid carcinoma. ? Hyperparathyroidism.  Certain kinds of thyroid diseases, such as Hashimoto's thyroiditis.  Lack of iodine in your diet.  A history of head and neck radiation, such as from previous cancer treatment. What are the signs or symptoms? In many cases, there are no symptoms. If you have symptoms, they may include:  A lump in your lower neck.  Feeling a lump or tickle in your throat.  Pain in your neck, jaw, or ear.  Having trouble swallowing. Hot nodules may cause symptoms that include:  Weight loss.  Warm, flushed skin.  Feeling hot.  Feeling  nervous.  A racing heartbeat. Cold nodules may cause symptoms that include:  Weight gain.  Dry skin.  Brittle hair. This may also occur with hair loss.  Feeling cold.  Fatigue. Thyroid cancer nodules may cause symptoms that include:  Hard nodules that feel stuck to the thyroid gland.  Hoarseness.  Lumps in the glands near your thyroid (lymph nodes). How is this diagnosed? A thyroid nodule may be felt by your health care provider during a physical exam. This condition may also be diagnosed based on your symptoms. You may also have tests, including:  An ultrasound. This may be done to confirm the diagnosis.  A biopsy. This involves taking a sample from the nodule and looking at it under a microscope.  Blood tests to make sure that your thyroid is working properly.  A thyroid scan. This test uses a radioactive tracer injected into a vein to create an image of the thyroid gland on a computer screen.  Imaging tests such as MRI or CT scan. These may be done if: ? Your nodule is large. ? Your nodule is blocking your airway. ? Cancer is suspected. How is this treated? Treatment depends on the cause and size of your nodule or nodules. If the nodule is benign, treatment may not be necessary. Your health care provider may monitor the nodule to see if it goes away without treatment. If the nodule continues to grow, is cancerous, or does not go away, treatment may be needed. Treatment may include:  Having a cystic nodule drained with a needle.  Ablation therapy.  In this treatment, alcohol is injected into the area of the nodule to destroy the cells. Ablation with heat (thermal ablation) may also be used.  Radioactive iodine. In this treatment, radioactive iodine is given as a pill or liquid that you drink. This substance causes the thyroid nodule to shrink.  Surgery to remove the nodule. Part or all of your thyroid gland may need to be removed as well.  Medicines. Follow these  instructions at home:  Pay attention to any changes in your nodule.  Take over-the-counter and prescription medicines only as told by your health care provider.  Keep all follow-up visits as told by your health care provider. This is important. Contact a health care provider if:  Your voice changes.  You have trouble swallowing.  You have pain in your neck, ear, or jaw that is getting worse.  Your nodule gets bigger.  Your nodule starts to make it harder for you to breathe.  Your muscles look like they are shrinking (muscle wasting). Get help right away if:  You have chest pain.  There is a loss of consciousness.  You have a sudden fever.  You feel confused.  You are seeing or hearing things that other people do not see or hear (having hallucinations).  You feel very weak.  You have mood swings.  You feel very restless.  You feel suddenly nauseous or throw up.  You suddenly have diarrhea. Summary  A thyroid nodule is an isolated growth of thyroid cells that forms a lump in your thyroid gland.  Thyroid nodules are common. Most are not cancerous (benign). You may have one nodule or several nodules.  Treatment depends on the cause and size of your nodule or nodules. If the nodule is benign, treatment may not be necessary.  Your health care provider may monitor the nodule to see if it goes away without treatment. If the nodule continues to grow, is cancerous, or does not go away, treatment may be needed. This information is not intended to replace advice given to you by your health care provider. Make sure you discuss any questions you have with your health care provider. Document Revised: 05/04/2018 Document Reviewed: 05/07/2018 Elsevier Patient Education  [place].

## 2020-12-31 NOTE — Progress Notes (Addendum)
Patient ID: Deborah Petty, female   DOB: April 10, 1981, 40 y.o.   MRN: [id number]   This visit occurred during the SARS-CoV-2 public health emergency.  Safety protocols were in place, including screening questions prior to the visit, additional usage of staff PPE, and extensive cleaning of exam room while observing appropriate contact time as indicated for disinfecting solutions.   HPI  Deborah Petty is a 40 y.o.-year-old female, referred by her PCP, Dr. Laqueta Petty, for evaluation and management of a new left thyroid nodule.  Patient describes that she gave birth in 05/2020.  After giving birth, she describes postpartum depression and anxiety, and also DVTs in her arm.  She started on Xarelto.  She also started to experience hair loss, anxiety, migraines, heaviness in her chest, shortness of breath, nausea, vomiting.  She saw OB/GYN and an EKG was initially read as normal but afterwards, he was noticed that had a short PR interval. She will see cardiology in several days. She has a FH of Afib.  Of note, she was taken off phentermine a week ago.  She continues to have the above symptoms even after stopping the medication.  Thyroid nodule: During her OB/GYN exam in 11/2020, a left thyroid nodule was palpated.  A thyroid ultrasound showed a 1 cm nodule. The recommendation was to check another ultrasound in 1 year but she presents today as  she is very worried about this nodule.  Thyroid U/S (11/24/2020): Parenchymal echotexture: Mildly heterogeneous Isthmus: 0.2 cm Right lobe: 5 x 1.7 x 1.8 cm Left lobe: 5.5 x 1.9 x 2 cm  Nodules larger than 1 cm: 1  Left mid thyroid nodule: Size: 1 x 0.8 x 1 cm Composition: Solid/almost completely solid (2) Echogenicity: Hypoechoic (2) Shape: Not taller than wide (0) Margins: Smooth (0) Echogenic foci: None (0) TI-RADS category: 4 *A follow-up ultrasound in 1 year is recommended.  Mildly enlarged, mildly heterogeneous thyroid gland with 1 nodule -1 year follow-up  recommended.  Pt denies: - feeling nodules in neck - hoarseness - dysphagia - choking - SOB with lying down    I reviewed pt's thyroid tests: 11/18/2020: TSH 3.22, total T3 122 (71-180), free T4 1.21 (0.82-1.77) -however, at that time she was on high-dose biotin. 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  In father: Thyroid nodules, hypothyroidism; cousin: thyroidectomy for nodules.  No FH of thyroid cancer. 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  No seaweed or kelp. No recent contrast studies. No steroid use. No herbal supplements. No Biotin supplements or Hair, Skin and Nails vitamins.  Recent labs reviewed per records from PCP: 11/18/2020:  HbA1c 4.9% CMP: Normal, glucose 70 Insulin: 4.9 (2.6-24.9) CBC with differential: Normal A.m. cortisol: 10 (6.2-19.4) -drawn at 8:16 AM  She also describes hyperhidrosis/hot flashes, which started in childhood.  These were previously investigated by endocrinology at [facility] in 2014 Deborah Petty, M.D.) without a clear pathology found.  I reviewed the records: Pt seen consultation for h/o Hyperhidrosis since childhood,  Heat intolerance/sweating, flushing, occ dizzy spells. Pt has had nl OSH TB test and urine 5HIAA. Pt has father with hyperthyroidism of unknown etiology. Pt with recent nl TSH, high FT4. P with h/o problems with irregular periods and fertility.  Pt after initial visit had isolated high estradiol level, but repeat labs with menses at [facility] showed normal estradiol and  estrone level, nl pituitary hormone/prln, nl adrenal tests (17ohp, dheas), nl ovarian tests/markers (inhibin B and A, and AMH), repeat nl tfts,  Pt has has nl cosyntropin test, nl IGF-I, total testosterone in higher normal range, pt with h/o acne and irregular periods and ovarian cyst, chronic hair loss, no hair growth, discuss pt possibly could have thin variant PCOS, will send bioavailable testosterone to assess.  Pt has had nl tryptase and cbc Pt has had nl thyroid antibodies, initial tfts at [facility] with mild free value changes, but repeat at [facility] normal (? Resolved silent thyroiditis vs assay issue) Pt c/o hand tremor, I don't think thyroid related given tfts nl, discussed if endo work up pt may want to discuss with pcp about seeing neurology (? Autonomic issue causing sweating and tremor), pt has no other neurological sxs at this time.   Adrenocorticotropic Hormone (ACTH) Specimen:  Blood  Ref Range & Units 7 yr ago Comments  ACTH 15 - 66 pg/mL 38      Reference range valid for 7:00 - 10:00 AM sample.  Specimen Collected: -- Last Resulted: --  Date: 03/25/13   Received From: [place] Encounter   Cortisol Specimen:  Blood  Ref Range & Units 7 yr ago Comments  Cortisol 5.0 - 25.0 mcg/dl 8.4  The reference range above applies to an AM draw sample. There is  a diurnal variation in cortisol secretion. The level at 8:00pm is  normally 50% the level at 8:00am. Loss of diurnal variation is  often seen in Cushing's Syndrome.  Specimen Draw Site  Venipunc    Specimen Collected: -- Last Resulted: --  Date: 03/25/13   Received From: [place] Encounter   Aldosterone Specimen:  Blood  Ref Range & Units 7 yr ago Comments  Aldosterone <=21 ng/dL 4.3  Reference range for patients 11 years and older is based on  upright A.M. collection from subjects without sodium  restrictions.  Test  Performed by:  [facility]  [place], [place] [address]  Laboratory Director: Sherrie George. Deborah Petty, M.D.  Specimen Draw Site  Venipunc    Specimen Collected: -- Last Resulted: --  Date: 03/25/13   Received From: [place] Encounter   Renin Activity, Plasma Specimen:  Blood  Ref Range & Units 7 yr ago Comments  Renin Activity, Plasma see fn 1.9  Units:ng/mL/h  -- REFERENCE VALUE --  (Peripheral vein specimen)  Na-deplete, upright:  Mean: 10.8  Range: 2.9-24  Na-replete, upright:  Mean: 1.9  Range: <=0.6-4.3  Test Performed by:  [facility]  [address] [place], [place] [address]  Laboratory Director: Sherrie George. Deborah Petty, M.D.  Specimen Collected: -- Last Resulted: --  Date: 03/25/13   Received From: [place] Encounter    Ref Range & Units 7 yr ago Comments  Testosterone, Free Serum 0.3 - 1.9 ng/dL 0.5  Testing performed by [facility].  Testosterone, Total Serum  8 - 60 ng/dL 42  Testing performed by Liquid Chromatography-Tandem Mass  Spectrometry (LC-MS/MS).  Test Performed by:  [facility]  [place], [place] [address]  Laboratory Director: Sherrie George. Deborah Petty, M.D.     Pt also has a history of lipoma removed from back.  ROS: Constitutional: + See HPI  Eyes: no blurry vision, no xerophthalmia ENT: no sore throat,  + see HPI Cardiovascular: no CP/SOB/palpitations/leg swelling Respiratory: no cough/SOB Gastrointestinal: no N/V/D/C Musculoskeletal: no muscle/joint aches Skin: no rashes Neurological: no tremors/numbness/tingling/dizziness Psychiatric: no depression/anxiety  Past Medical History:  Diagnosis Date  . Abnormal thyroid blood test 03/25/2013  . Anxiety   . Breech presentation 06/07/2016  . Chronic right-sided low back  pain 09/18/2018  . Complication of anesthesia    review chart from last CS.  Level went high couldn't see,   . DDD (degenerative disc disease), cervical   . Depression    post partum meds taken  . Dizzy spells 03/25/2013  . DVT (deep venous thrombosis) (HCC)    Left arm  . Hair loss 03/25/2013  . 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 Insomnia   . Lipoma of lower back 10/24/2017  . Medical history non-contributory   . Migraines 03/25/2013  . Paresthesia of right foot 08/08/2018  . Postpartum care following cesarean delivery (9/5) 06/07/2016  . Previous cesarean delivery affecting pregnancy 05/17/2020  . Previous cesarean section 05/17/2020  . Ptosis of left eyelid 12/08/2014  . Status post repeat low transverse cesarean section 8/15 05/18/2020   Past Surgical History:  Procedure Laterality Date  . BACK SURGERY     lipoma removal  . CESAREAN SECTION N/A 06/07/2016   Procedure: PRIMARY CESAREAN SECTION - BREECH PRESENTATION;  Surgeon: Brien Few, MD;  Location: [place];  Service: Obstetrics;  Laterality: N/A;  EDD 06/07/2016  . CESAREAN SECTION N/A 05/17/2020   Procedure: Repeat CESAREAN SECTION;  Surgeon: Brien Few, MD;  Location: [facility];  Service: Obstetrics;  Laterality: N/A;  EDD: 05/23/20  . TONSILLECTOMY     Social History   Socioeconomic History  . Marital status: Married    Spouse name: Not on file  . Number of children: Not on file  . Years of education: Not on file  . Highest education level: Not on file  Occupational History  . Not on file  Tobacco Use  . Smoking status: Never Smoker  . Smokeless tobacco: Never Used  Substance and Sexual Activity  . Alcohol use: Yes    Comment: socially  . Drug use: No  . Sexual activity: Yes    Birth control/protection: None  Other Topics Concern  . Not on file  Social History Narrative   ** Merged History Encounter **       Social Determinants of Health   Financial Resource Strain: Not on file  Food Insecurity: Not on  file  Transportation Needs: Not on file  Physical Activity: Not on file  Stress: Not on file  Social Connections: Not on file  Intimate Partner Violence: Not on file   No current outpatient medications on file prior to visit.   No current facility-administered medications on file prior to visit.   No Known Allergies Family History  Problem Relation Age of Onset  . Hyperlipidemia Father   . Heart disease Father   . Heart attack Maternal Grandmother   . Diabetes Maternal Grandmother   . Heart disease Paternal Grandfather   . Diabetes Paternal Grandfather  PE: BP 110/72 (BP Location: Left Arm, Patient Position: Sitting, Cuff Size: Normal)   Pulse 82   Ht 5' 5" (1.651 m)   Wt 162 lb 9.6 oz (73.8 kg)   SpO2 97%   BMI 27.06 kg/m  Wt Readings from Last 3 Encounters:  12/31/20 162 lb 9.6 oz (73.8 kg)  05/04/20 188 lb (85.3 kg)  06/07/16 177 lb (80.3 kg)   Constitutional: overweight, in NAD Eyes: PERRLA, EOMI, no exophthalmos ENT: moist mucous membranes, no thyromegaly, slight left thyroid fullness but no individual nodule palpated, no cervical lymphadenopathy Cardiovascular: RRR, No MRG Respiratory: CTA B Gastrointestinal: abdomen soft, NT, ND, BS+ Musculoskeletal: no deformities, strength intact in all 4;  Skin: moist, warm, no rashes Neurological: no tremor with outstretched hands, DTR normal in all 4  ASSESSMENT: 1. Thyroid nodule  2.  Her last   PLAN: 1. Thyroid nodule - she denies neck compression symptoms - I reviewed the report of her thyroid ultrasound along with the patient. I pointed out that the L nodule is small, only 1 cm and also solid and hypoechoic.  It does not have other concerning features including: - microcalcifications - internal blood flow - Taller than wide distribution - Invagination in the surrounding tissue -Pt does not have a thyroid cancer family history or a personal history of RxTx to head/neck. All these would favor benignity.  -We  discussed that these nodules usually grow very slowly and the vast majority of them are noncancerous.  Even if they turn out to be cancerous later, intervention at that point has not been shown to be inferior to more aggressive, immediate, intervention. - However, patient is very nervous about this nodule and required not to wait another year to be evaluated for this. - I explained that the nodule is very small and may be difficult to biopsy and she agrees to wait another year before imaging but only in case it cannot be biopsied. - Therefore, at today's visit, I ordered her FNA - I explained that this is not cancer, we can continue to follow her on a yearly basis for 5 years - we discussed that surgery may have side effects and also she might have a risk of ~25% of becoming hypothyroid after hemithyroidectomy.  - I'll see her back in a year, assuming her FNA is normal. If FNA abnormal, we will meet sooner.   2.  Hair loss -Significant postpartum -Patient may continue the hair skin and nails vitamins, however, she needs to at least 2 weeks prior to labs Petty to biotin interfering with the assay.  She was on high-dose biotin when the last set of labs are checked and I would like to repeat them off the supplement.  I advised her to stop the supplement for 2 weeks and then return for a TSH, free T4, and will also screen her for Hashimoto's thyroiditis by TPO and ATA antibodies.   - we discussed about  Hashimoto thyroiditis  as an autoimmune disorder, in which the immune system attacks her own thyroid. The antibodies bind to the thyroid tissue and cause inflammation, and, eventually, destruction of the gland and hypothyroidism. We don't know how long this process can be, it can last from months to years.  In pts with a family member with Hashimoto's thyroiditis the risk for developing the disease is higher.  - I explained that thyroid enlargement especially at the beginning of her Hashimoto thyroiditis course  is not uncommon, and it has a waxing and  waning character.  - We discussed about treatment for Hashimoto thyroiditis in case she does have this, which is actually limited to thyroid hormones in case the TFTs are abnormal. Supplements like selenium has been tried with various results, some showing improvement in the TPO antibodies. However, there are no randomized controlled trials of this are consistent results between trials. We also discussed about ways to improve her immune system (relaxation, diet, exercise, sleep) to reduce the Ab titer and, subsequently, the thyroid inflammation. -She has hair loss but also a myriad of sxs that do not align with her thyroid tests.  It is possible that her symptoms were Petty to phentermine but this was stopped 1 week ago and the symptoms persist.  She will have appointment with cardiology soon.  Orders Placed This Encounter  Procedures  . [place] FNA BX THYROID 1ST LESION AFIRMA  . TSH  . T4, free  . T3, free  . Thyroid peroxidase antibody  . Thyroglobulin antibody   Addendum (01/18/2021): Component     Latest Ref Rng & Units 01/14/2021  Thyroglobulin Ab     < or = 1 IU/mL <1  Thyroperoxidase Ab SerPl-aCnc     <9 IU/mL <1  Triiodothyronine,Free,Serum     2.3 - 4.2 pg/mL 3.0  T4,Free(Direct)     0.60 - 1.60 ng/dL 0.92  TSH     0.35 - 4.50 uIU/mL 1.36  TFTs are all normal.  Her thyroid antibodies are also normal.  Thyroid U/S (01/15/2021): US THYROID CLINICAL DATA:  Prior ultrasound demonstrated a hypoechoic nodule in left thyroid lobe. Patient presents for ultrasound-guided fine-needle aspiration of the nodule.  EXAM: THYROID ULTRASOUND  TECHNIQUE: Ultrasound examination of the thyroid gland and adjacent soft tissues was performed.  COMPARISON:  Thyroid ultrasound 11/24/2020  FINDINGS: The area of concern is along the posterior aspect of the left thyroid lobe. This area was thoroughly evaluated with more than one ultrasound transducer. 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 This area is slightly hypoechoic to the surrounding parenchyma on the longitudinal images but cannot be reproduced on the transverse images. No discrete nodule identified in the left thyroid lobe.  IMPRESSION: No discrete left thyroid nodule. 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 Biopsy was not performed.  Electronically Signed   By: Markus Daft M.D.   On: 01/15/2021 12:34  No discrete nodule evident.  No follow-up needed.   Philemon Kingdom, MD PhD [facility] Endocrinology

## 2021-01-07 DIAGNOSIS — F419 Anxiety disorder, unspecified: Secondary | ICD-10-CM | POA: Insufficient documentation

## 2021-01-07 DIAGNOSIS — Z789 Other specified health status: Secondary | ICD-10-CM | POA: Insufficient documentation

## 2021-01-07 DIAGNOSIS — I82409 Acute embolism and thrombosis of unspecified deep veins of unspecified lower extremity: Secondary | ICD-10-CM | POA: Insufficient documentation

## 2021-01-07 DIAGNOSIS — G4700 Insomnia, unspecified: Secondary | ICD-10-CM | POA: Insufficient documentation

## 2021-01-07 DIAGNOSIS — M5030 Other cervical disc degeneration, unspecified cervical region: Secondary | ICD-10-CM | POA: Insufficient documentation

## 2021-01-07 DIAGNOSIS — T8859XA Other complications of anesthesia, initial encounter: Secondary | ICD-10-CM | POA: Insufficient documentation

## 2021-01-08 ENCOUNTER — Ambulatory Visit: Payer: Managed Care, Other (non HMO) | Admitting: Internal Medicine

## 2021-01-11 ENCOUNTER — Other Ambulatory Visit: Payer: Self-pay

## 2021-01-11 ENCOUNTER — Encounter: Payer: Self-pay | Admitting: Cardiology

## 2021-01-11 ENCOUNTER — Ambulatory Visit (INDEPENDENT_AMBULATORY_CARE_PROVIDER_SITE_OTHER): Payer: Managed Care, Other (non HMO)

## 2021-01-11 ENCOUNTER — Ambulatory Visit: Payer: Managed Care, Other (non HMO) | Admitting: Cardiology

## 2021-01-11 VITALS — BP 100/62 | HR 76 | Ht 66.0 in | Wt 164.6 lb

## 2021-01-11 DIAGNOSIS — R002 Palpitations: Secondary | ICD-10-CM | POA: Diagnosis not present

## 2021-01-11 DIAGNOSIS — R0602 Shortness of breath: Secondary | ICD-10-CM

## 2021-01-11 DIAGNOSIS — Z86718 Personal history of other venous thrombosis and embolism: Secondary | ICD-10-CM

## 2021-01-11 NOTE — Addendum Note (Signed)
Addended by: Orvan July on: 01/11/2021 01:54 PM   Modules accepted: Orders

## 2021-01-11 NOTE — Progress Notes (Signed)
Cardiology Office Note:    Date:  01/11/2021   ID:  Deborah Petty, [place]roberts [birth year], MRN [id number]  PCP:  Ernestene Kiel, MD  Cardiologist:  Berniece Salines, DO  Electrophysiologist:  None   Referring MD: Ernestene Kiel, MD   I have been feeling some palpitations and shortness of breath  History of Present Illness:    Deborah Petty is a 40 y.o. female with a hx of DVT 6 weeks after delivery in August 2021 was treated with Xarelto for 4 months and then stop, recently diagnosed thyroid nodule is here today to be evaluated for palpitation and shortness of breath.  Patient tells me the last several months she has been experiencing some different feelings where she feels her heart is fluttering and at times she gets significant shortness of breath with it.  Of recent she has felt nausea as well as dizziness.  Sometimes this is worse that she has to stop whenever she is going go outside to get some fresh air.  Thankfully she has not passed out.  However she is concerned about this given atrial fibrillation in her father.  Of note she recently had a thyroid nodule found on an ultrasound 1 cm and has plan for needle biopsy this Friday.  Past Medical History:  Diagnosis Date  . Abnormal thyroid blood test 03/25/2013  . Anxiety   . Breech presentation 06/07/2016  . Chronic right-sided low back pain 09/18/2018  . Complication of anesthesia    review chart from last CS.  Level went high couldn't see,   . DDD (degenerative disc disease), cervical   . Depression    post partum meds taken  . Dizzy spells 03/25/2013  . DVT (deep venous thrombosis) (HCC)    Left arm  . Hair loss 03/25/2013  . 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 Insomnia   . Lipoma of lower back 10/24/2017  . Medical history non-contributory   . Migraines 03/25/2013  . Paresthesia of right foot 08/08/2018  . Postpartum care following cesarean delivery (9/5) 06/07/2016  . Previous cesarean delivery affecting pregnancy 05/17/2020  . Previous  cesarean section 05/17/2020  . Ptosis of left eyelid 12/08/2014  . Status post repeat low transverse cesarean section 8/15 05/18/2020    Past Surgical History:  Procedure Laterality Date  . BACK SURGERY     lipoma removal  . CESAREAN SECTION N/A 06/07/2016   Procedure: PRIMARY CESAREAN SECTION - BREECH PRESENTATION;  Surgeon: Brien Few, MD;  Location: [facility];  Service: Obstetrics;  Laterality: N/A;  EDD 06/07/2016  . CESAREAN SECTION N/A 05/17/2020   Procedure: Repeat CESAREAN SECTION;  Surgeon: Brien Few, MD;  Location: [facility];  Service: Obstetrics;  Laterality: N/A;  EDD: 05/23/20  . TONSILLECTOMY      Current Medications: Current Meds  Medication Sig  . buPROPion (WELLBUTRIN XL) 300 MG 24 hr tablet Take 300 mg by mouth daily.  Marland Kitchen desvenlafaxine (PRISTIQ) 50 MG 24 hr tablet Take 50 mg by mouth daily.  Marland Kitchen gabapentin (NEURONTIN) 300 MG capsule Take 300 mg by mouth at bedtime as needed.  Marland Kitchen oxybutynin (DITROPAN-XL) 10 MG 24 hr tablet Take 10 mg by mouth at bedtime.     Allergies:   Patient has no known allergies.   Social History   Socioeconomic History  . Marital status: Married    Spouse name: Not on file  . Number of children: Not on file  . Years of education: Not on file  . Highest education level: Not on file  Occupational History  . Not on file  Tobacco Use  . Smoking status: Never Smoker  . Smokeless tobacco: Never Used  Substance and Sexual Activity  . Alcohol use: Not Currently    Comment: socially  . Drug use: No  . Sexual activity: Yes    Birth control/protection: None  Other Topics Concern  . Not on file  Social History Narrative   ** Merged History Encounter **       Social Determinants of Health   Financial Resource Strain: Not on file  Food Insecurity: Not on file  Transportation Needs: Not on file  Physical Activity: Not on file  Stress: Not on file  Social Connections: Not on file     Family History: The patient's family  history includes COPD in her father; Diabetes in her father, maternal grandmother, and paternal grandfather; Heart attack in her maternal grandmother; Heart disease in her paternal grandfather; Hyperlipidemia in her father and mother; Hypertension in her father and mother; Osteoarthritis in her mother; Thyroid disease in her father.  ROS:   Review of Systems  Constitution: Negative for decreased appetite, fever and weight gain.  HENT: Negative for congestion, ear discharge, hoarse voice and sore throat.   Eyes: Negative for discharge, redness, vision loss in right eye and visual halos.  Cardiovascular: Negative for chest pain, dyspnea on exertion, leg swelling, orthopnea and palpitations.  Respiratory: Negative for cough, hemoptysis, shortness of breath and snoring.   Endocrine: Negative for heat intolerance and polyphagia.  Hematologic/Lymphatic: Negative for bleeding problem. Does not bruise/bleed easily.  Skin: Negative for flushing, nail changes, rash and suspicious lesions.  Musculoskeletal: Negative for arthritis, joint pain, muscle cramps, myalgias, neck pain and stiffness.  Gastrointestinal: Negative for abdominal pain, bowel incontinence, diarrhea and excessive appetite.  Genitourinary: Negative for decreased libido, genital sores and incomplete emptying.  Neurological: Negative for brief paralysis, focal weakness, headaches and loss of balance.  Psychiatric/Behavioral: Negative for altered mental status, depression and suicidal ideas.  Allergic/Immunologic: Negative for HIV exposure and persistent infections.    EKGs/Labs/Other Studies Reviewed:    The following studies were reviewed today:   EKG:  The ekg ordered today demonstrates sinus rhythm, heart rate 76 bpm with underlying incomplete right bundle branch block and nonspecific ST changes.  Recent Labs: 05/18/2020: Hemoglobin 10.6; Platelets 188  Recent Lipid Panel No results found for: CHOL, TRIG, HDL, CHOLHDL, VLDL,  LDLCALC, LDLDIRECT  Physical Exam:    VS:  BP 100/62 (BP Location: Right Arm)   Pulse 76   Ht 5\' 6"  (1.676 m)   Wt 164 lb 9.6 oz (74.7 kg)   SpO2 98%   BMI 26.57 kg/m     Wt Readings from Last 3 Encounters:  01/11/21 164 lb 9.6 oz (74.7 kg)  12/31/20 162 lb 9.6 oz (73.8 kg)  05/04/20 188 lb (85.3 kg)     GEN: Well nourished, well developed in no acute distress HEENT: Normal NECK: No JVD; No carotid bruits LYMPHATICS: No lymphadenopathy CARDIAC: S1S2 noted,RRR, no murmurs, rubs, gallops RESPIRATORY:  Clear to auscultation without rales, wheezing or rhonchi  ABDOMEN: Soft, non-tender, non-distended, +bowel sounds, no guarding. EXTREMITIES: No edema, No cyanosis, no clubbing MUSCULOSKELETAL:  No deformity  SKIN: Warm and dry NEUROLOGIC:  Alert and oriented x 3, non-focal PSYCHIATRIC:  Normal affect, good insight  ASSESSMENT:    1. Palpitations   2. Shortness of breath   3. History of DVT (deep vein thrombosis)    PLAN:    I would like  to rule out a cardiovascular etiology of this palpitation, therefore at this time I would like to placed a zio patch for   14 days. In additon for her shortness of breath a transthoracic echocardiogram will be ordered to assess LV/RV function and any structural abnormalities. Once these testing have been performed amd reviewed further reccomendations will be made.   She was on anticoagulation for DVT post pregnancy in her upper extremity.  She did take Xarelto for 4 months and stopped due to significant bleeding which required blood transfusion.  She has never been referred to hematology for hypercoagulable work-up I do believe this young patient should be evaluated by hematology there for me to refer her.   The patient is in agreement with the above plan. The patient left the office in stable condition.  The patient will follow up in 3 months or sooner if needed.   Medication Adjustments/Labs and Tests Ordered: Current medicines are  reviewed at length with the patient today.  Concerns regarding medicines are outlined above.  Orders Placed This Encounter  Procedures  . Ambulatory referral to Hematology / Oncology  . LONG TERM MONITOR (3-14 DAYS)  . EKG 12-Lead  . ECHOCARDIOGRAM COMPLETE   No orders of the defined types were placed in this encounter.   Patient Instructions   Medication Instructions:  Your physician recommends that you continue on your current medications as directed. Please refer to the Current Medication list given to you today.  *If you need a refill on your cardiac medications before your next appointment, please call your pharmacy*   Lab Work: None If you have labs (blood work) drawn today and your tests are completely normal, you will receive your results only by: Marland Kitchen MyChart Message (if you have MyChart) OR . A paper copy in the mail If you have any lab test that is abnormal or we need to change your treatment, we will call you to review the results.   Testing/Procedures: Your physician has requested that you have an echocardiogram. Echocardiography is a painless test that uses sound waves to create images of your heart. It provides your doctor with information about the size and shape of your heart and how well your heart's chambers and valves are working. This procedure takes approximately one hour. There are no restrictions for this procedure. A zio monitor was ordered today. It will remain on for 14 days. You will then return monitor and event diary in provided box. It takes 1-2 weeks for report to be downloaded and returned to [place]. We will call you with the results. If monitor falls off or has orange flashing light, please call Zio for further instructions.     Follow-Up: At [facility], you and your health needs are our priority.  As part of our continuing mission to provide you with exceptional heart care, we have created designated Provider Care Teams.  These Care Teams include your  primary Cardiologist (physician) and Advanced Practice Providers (APPs -  Physician Assistants and Nurse Practitioners) who all work together to provide you with the care you need, when you need it.  We recommend signing up for the patient portal called "MyChart".  Sign up information is provided on this After Visit Summary.  MyChart is used to connect with patients for Virtual Visits (Telemedicine).  Patients are able to view lab/test results, encounter notes, upcoming appointments, etc.  Non-urgent messages can be sent to your provider as well.   To learn more about what you can  do with MyChart, go to [email or web address].    Your next appointment:   3 month(s)  The format for your next appointment:   In Person  Provider:   Berniece Salines, DO   Other Instructions  WHY IS MY DOCTOR PRESCRIBING ZIO? The Zio system is proven and trusted by physicians to detect and diagnose irregular heart rhythms -- and has been prescribed to hundreds of thousands of patients.  The FDA has cleared the Zio system to monitor for many different kinds of irregular heart rhythms. In a study, physicians were able to reach a diagnosis 90% of the time with the Zio system1.  You can wear the Zio monitor -- a small, discreet, comfortable patch -- during your normal day-to-day activity, including while you sleep, shower, and exercise, while it records every single heartbeat for analysis.  1Barrett, P., et al. Comparison of 24 Hour Holter Monitoring Versus 14 Day Novel Adhesive Patch Electrocardiographic Monitoring. [place], 2014.  ZIO VS. HOLTER MONITORING The Zio monitor can be comfortably worn for up to 14 days. Holter monitors can be worn for 24 to 48 hours, limiting the time to record any irregular heart rhythms you may have. Zio is able to capture data for the 51% of patients who have their first symptom-triggered arrhythmia after 48 hours.1  LIVE WITHOUT RESTRICTIONS The Zio ambulatory  cardiac monitor is a small, unobtrusive, and water-resistant patch--you might even forget you're wearing it. The Zio monitor records and stores every beat of your heart, whether you're sleeping, working out, or showering.  Echocardiogram An echocardiogram is a test that uses sound waves (ultrasound) to produce images of the heart. Images from an echocardiogram can provide important information about:  Heart size and shape.  The size and thickness and movement of your heart's walls.  Heart muscle function and strength.  Heart valve function or if you have stenosis. Stenosis is when the heart valves are too narrow.  If blood is flowing backward through the heart valves (regurgitation).  A tumor or infectious growth around the heart valves.  Areas of heart muscle that are not working well because of poor blood flow or injury from a heart attack.  Aneurysm detection. An aneurysm is a weak or damaged part of an artery wall. The wall bulges out from the normal force of blood pumping through the body. Tell a health care provider about:  Any allergies you have.  All medicines you are taking, including vitamins, herbs, eye drops, creams, and over-the-counter medicines.  Any blood disorders you have.  Any surgeries you have had.  Any medical conditions you have.  Whether you are pregnant or may be pregnant. What are the risks? Generally, this is a safe test. However, problems may occur, including an allergic reaction to dye (contrast) that may be used during the test. What happens before the test? No specific preparation is needed. You may eat and drink normally. What happens during the test?  You will take off your clothes from the waist up and put on a hospital gown.  Electrodes or electrocardiogram (ECG)patches may be placed on your chest. The electrodes or patches are then connected to a device that monitors your heart rate and rhythm.  You will lie down on a table for an  ultrasound exam. A gel will be applied to your chest to help sound waves pass through your skin.  A handheld device, called a transducer, will be pressed against your chest and moved over your heart. The transducer  produces sound waves that travel to your heart and bounce back (or "echo" back) to the transducer. These sound waves will be captured in real-time and changed into images of your heart that can be viewed on a video monitor. The images will be recorded on a computer and reviewed by your health care provider.  You may be asked to change positions or hold your breath for a short time. This makes it easier to get different views or better views of your heart.  In some cases, you may receive contrast through an IV in one of your veins. This can improve the quality of the pictures from your heart. The procedure may vary among health care providers and hospitals.   What can I expect after the test? You may return to your normal, everyday life, including diet, activities, and medicines, unless your health care provider tells you not to do that. Follow these instructions at home:  It is up to you to get the results of your test. Ask your health care provider, or the department that is doing the test, when your results will be ready.  Keep all follow-up visits. This is important. Summary  An echocardiogram is a test that uses sound waves (ultrasound) to produce images of the heart.  Images from an echocardiogram can provide important information about the size and shape of your heart, heart muscle function, heart valve function, and other possible heart problems.  You do not need to do anything to prepare before this test. You may eat and drink normally.  After the echocardiogram is completed, you may return to your normal, everyday life, unless your health care provider tells you not to do that. This information is not intended to replace advice given to you by your health care provider.  Make sure you discuss any questions you have with your health care provider. Document Revised: 05/12/2020 Document Reviewed: 05/12/2020 Elsevier Patient Education  2021 [employer].      Adopting a Healthy Lifestyle.  Know what a healthy weight is for you (roughly BMI <25) and aim to maintain this   Aim for 7+ servings of fruits and vegetables daily   65-80+ fluid ounces of water or unsweet tea for healthy kidneys   Limit to max 1 drink of alcohol per day; avoid smoking/tobacco   Limit animal fats in diet for cholesterol and heart health - choose grass fed whenever available   Avoid highly processed foods, and foods high in saturated/trans fats   Aim for low stress - take time to unwind and care for your mental health   Aim for 150 min of moderate intensity exercise weekly for heart health, and weights twice weekly for bone health   Aim for 7-9 hours of sleep daily   When it comes to diets, agreement about the perfect plan isnt easy to find, even among the experts. Experts at the [employer] developed an idea known as the Healthy Eating Plate. Just imagine a plate divided into logical, healthy portions.   The emphasis is on diet quality:   Load up on vegetables and fruits - one-half of your plate: Aim for color and variety, and remember that potatoes dont count.   Go for whole grains - one-quarter of your plate: Whole wheat, barley, wheat berries, quinoa, oats, brown rice, and foods made with them. If you want pasta, go with whole wheat pasta.   Protein power - one-quarter of your plate: Fish, chicken, beans, and nuts are  all healthy, versatile protein sources. Limit red meat.   The diet, however, does go beyond the plate, offering a few other suggestions.   Use healthy plant oils, such as olive, canola, soy, corn, sunflower and peanut. Check the labels, and avoid partially hydrogenated oil, which have unhealthy trans fats.   If youre thirsty, drink  water. Coffee and tea are good in moderation, but skip sugary drinks and limit milk and dairy products to one or two daily servings.   The type of carbohydrate in the diet is more important than the amount. Some sources of carbohydrates, such as vegetables, fruits, whole grains, and beans-are healthier than others.   Finally, stay active  Signed, Berniece Salines, DO  01/11/2021 1:23 PM    [facility]

## 2021-01-11 NOTE — Patient Instructions (Signed)
Medication Instructions:  Your physician recommends that you continue on your current medications as directed. Please refer to the Current Medication list given to you today.  *If you need a refill on your cardiac medications before your next appointment, please call your pharmacy*   Lab Work: None If you have labs (blood work) drawn today and your tests are completely normal, you will receive your results only by: Marland Kitchen MyChart Message (if you have MyChart) OR . A paper copy in the mail If you have any lab test that is abnormal or we need to change your treatment, we will call you to review the results.   Testing/Procedures: Your physician has requested that you have an echocardiogram. Echocardiography is a painless test that uses sound waves to create images of your heart. It provides your doctor with information about the size and shape of your heart and how well your heart's chambers and valves are working. This procedure takes approximately one hour. There are no restrictions for this procedure. A zio monitor was ordered today. It will remain on for 14 days. You will then return monitor and event diary in provided box. It takes 1-2 weeks for report to be downloaded and returned to [place]. We will call you with the results. If monitor falls off or has orange flashing light, please call Zio for further instructions.     Follow-Up: At [facility], you and your health needs are our priority.  As part of our continuing mission to provide you with exceptional heart care, we have created designated Provider Care Teams.  These Care Teams include your primary Cardiologist (physician) and Advanced Practice Providers (APPs -  Physician Assistants and Nurse Practitioners) who all work together to provide you with the care you need, when you need it.  We recommend signing up for the patient portal called "MyChart".  Sign up information is provided on this After Visit Summary.  MyChart is used to connect  with patients for Virtual Visits (Telemedicine).  Patients are able to view lab/test results, encounter notes, upcoming appointments, etc.  Non-urgent messages can be sent to your provider as well.   To learn more about what you can do with MyChart, go to [email or web address].    Your next appointment:   3 month(s)  The format for your next appointment:   In Person  Provider:   Berniece Salines, DO   Other Instructions  WHY IS MY DOCTOR PRESCRIBING ZIO? The Zio system is proven and trusted by physicians to detect and diagnose irregular heart rhythms -- and has been prescribed to hundreds of thousands of patients.  The FDA has cleared the Zio system to monitor for many different kinds of irregular heart rhythms. In a study, physicians were able to reach a diagnosis 90% of the time with the Zio system1.  You can wear the Zio monitor -- a small, discreet, comfortable patch -- during your normal day-to-day activity, including while you sleep, shower, and exercise, while it records every single heartbeat for analysis.  1Barrett, P., et al. Comparison of 24 Hour Holter Monitoring Versus 14 Day Novel Adhesive Patch Electrocardiographic Monitoring. [place], 2014.  ZIO VS. HOLTER MONITORING The Zio monitor can be comfortably worn for up to 14 days. Holter monitors can be worn for 24 to 48 hours, limiting the time to record any irregular heart rhythms you may have. Zio is able to capture data for the 51% of patients who have their first symptom-triggered arrhythmia after 48 hours.1  LIVE WITHOUT RESTRICTIONS The Zio ambulatory cardiac monitor is a small, unobtrusive, and water-resistant patch--you might even forget you're wearing it. The Zio monitor records and stores every beat of your heart, whether you're sleeping, working out, or showering.  Echocardiogram An echocardiogram is a test that uses sound waves (ultrasound) to produce images of the heart. Images from an  echocardiogram can provide important information about:  Heart size and shape.  The size and thickness and movement of your heart's walls.  Heart muscle function and strength.  Heart valve function or if you have stenosis. Stenosis is when the heart valves are too narrow.  If blood is flowing backward through the heart valves (regurgitation).  A tumor or infectious growth around the heart valves.  Areas of heart muscle that are not working well because of poor blood flow or injury from a heart attack.  Aneurysm detection. An aneurysm is a weak or damaged part of an artery wall. The wall bulges out from the normal force of blood pumping through the body. Tell a health care provider about:  Any allergies you have.  All medicines you are taking, including vitamins, herbs, eye drops, creams, and over-the-counter medicines.  Any blood disorders you have.  Any surgeries you have had.  Any medical conditions you have.  Whether you are pregnant or may be pregnant. What are the risks? Generally, this is a safe test. However, problems may occur, including an allergic reaction to dye (contrast) that may be used during the test. What happens before the test? No specific preparation is needed. You may eat and drink normally. What happens during the test?  You will take off your clothes from the waist up and put on a hospital gown.  Electrodes or electrocardiogram (ECG)patches may be placed on your chest. The electrodes or patches are then connected to a device that monitors your heart rate and rhythm.  You will lie down on a table for an ultrasound exam. A gel will be applied to your chest to help sound waves pass through your skin.  A handheld device, called a transducer, will be pressed against your chest and moved over your heart. The transducer produces sound waves that travel to your heart and bounce back (or "echo" back) to the transducer. These sound waves will be captured in  real-time and changed into images of your heart that can be viewed on a video monitor. The images will be recorded on a computer and reviewed by your health care provider.  You may be asked to change positions or hold your breath for a short time. This makes it easier to get different views or better views of your heart.  In some cases, you may receive contrast through an IV in one of your veins. This can improve the quality of the pictures from your heart. The procedure may vary among health care providers and hospitals.   What can I expect after the test? You may return to your normal, everyday life, including diet, activities, and medicines, unless your health care provider tells you not to do that. Follow these instructions at home:  It is up to you to get the results of your test. Ask your health care provider, or the department that is doing the test, when your results will be ready.  Keep all follow-up visits. This is important. Summary  An echocardiogram is a test that uses sound waves (ultrasound) to produce images of the heart.  Images from an echocardiogram can provide important  information about the size and shape of your heart, heart muscle function, heart valve function, and other possible heart problems.  You do not need to do anything to prepare before this test. You may eat and drink normally.  After the echocardiogram is completed, you may return to your normal, everyday life, unless your health care provider tells you not to do that. This information is not intended to replace advice given to you by your health care provider. Make sure you discuss any questions you have with your health care provider. Document Revised: 05/12/2020 Document Reviewed: 05/12/2020 Elsevier Patient Education  2021 [employer].

## 2021-01-14 ENCOUNTER — Other Ambulatory Visit (INDEPENDENT_AMBULATORY_CARE_PROVIDER_SITE_OTHER): Payer: Managed Care, Other (non HMO)

## 2021-01-14 ENCOUNTER — Other Ambulatory Visit: Payer: Self-pay

## 2021-01-14 ENCOUNTER — Telehealth: Payer: Self-pay | Admitting: Oncology

## 2021-01-14 DIAGNOSIS — E041 Nontoxic single thyroid nodule: Secondary | ICD-10-CM | POA: Diagnosis not present

## 2021-01-14 DIAGNOSIS — L659 Nonscarring hair loss, unspecified: Secondary | ICD-10-CM | POA: Diagnosis not present

## 2021-01-14 LAB — T3, FREE: T3, Free: 3 pg/mL (ref 2.3–4.2)

## 2021-01-14 LAB — T4, FREE: Free T4: 0.92 ng/dL (ref 0.60–1.60)

## 2021-01-14 LAB — TSH: TSH: 1.36 u[IU]/mL (ref 0.35–4.50)

## 2021-01-14 NOTE — Telephone Encounter (Signed)
Patient referred by Dr Berniece Salines for DVT.  Appt made for 02/09/21 Consult at 2:15 pm

## 2021-01-15 ENCOUNTER — Other Ambulatory Visit: Payer: Self-pay | Admitting: Internal Medicine

## 2021-01-15 ENCOUNTER — Ambulatory Visit
Admission: RE | Admit: 2021-01-15 | Discharge: 2021-01-15 | Disposition: A | Discharge status: Home / Self Care | Payer: Managed Care, Other (non HMO) | Source: Ambulatory Visit | Attending: Internal Medicine | Admitting: Internal Medicine

## 2021-01-15 DIAGNOSIS — E041 Nontoxic single thyroid nodule: Secondary | ICD-10-CM

## 2021-01-15 LAB — THYROID PEROXIDASE ANTIBODY: Thyroperoxidase Ab SerPl-aCnc: <1 IU/mL (ref <9)

## 2021-01-15 LAB — THYROGLOBULIN ANTIBODY: Thyroglobulin Ab: <1 IU/mL (ref <=1)

## 2021-01-16 DIAGNOSIS — R002 Palpitations: Secondary | ICD-10-CM

## 2021-02-04 ENCOUNTER — Ambulatory Visit (INDEPENDENT_AMBULATORY_CARE_PROVIDER_SITE_OTHER): Payer: Managed Care, Other (non HMO)

## 2021-02-04 ENCOUNTER — Other Ambulatory Visit: Payer: Self-pay

## 2021-02-04 DIAGNOSIS — R0602 Shortness of breath: Secondary | ICD-10-CM

## 2021-02-04 LAB — ECHOCARDIOGRAM COMPLETE
Area-P 1/2: 3.36 cm2
S' Lateral: 3.1 cm

## 2021-02-04 NOTE — Progress Notes (Signed)
Complete echocardiogram performed.  Jimmy Kyann Heydt RDCS, RVT  

## 2021-02-08 MED ORDER — METOPROLOL SUCCINATE ER 25 MG PO TB24: 12.5000 mg | ORAL_TABLET | Freq: Every day | ORAL | 3 refills | Status: DC

## 2021-02-08 NOTE — Progress Notes (Signed)
[facility]  [address] [place],  [place]  [address] [phone]  Clinic Day:  02/08/2021  Referring physician: Berniece Salines, DO   HISTORY OF PRESENT ILLNESS:  The patient is a 40 y.o. female  who I was asked to consult upon for the history of a "DVT" in her right arm.  In mid August 2021, the patient gave birth to her 2nd child.  During that hospitalization, an IV was placed in her distal left forearm.  At the time of discharge, the patient recalls seeing her distal left arm being mildly swollen, but she did not think that much about it.  However, over time, her left forearm became progressively swollen.  She also began having acute pain when anything brushed against her left forearm.  This led to her being seen by her primary care office approximately 6 weeks after her delivery.  When D-dimer testing came back positive, she underwent a Doppler ultrasound of her left arm, which revealed a clot in her distal cephalic vein.  Despite this being a superficial vein, the clot was characterized as being a DVT.  This led to her being placed on Xarelto, which she took for 4 months.  Unfortunately, she had severe vaginal bleeding throughout her 4 months of treatment, despite being given estrogen-based therapy to  curtail her profuse vaginal blood loss.  She eventually finished her Xarelto in January 2022.  As she has had heart palpitations, occasional shortness of breath, and infrequent finds of both SVT and PVCs, she comes in today to determine if some type of underlying clotting disorder is present that could be potentially triggering such problems.  PAST MEDICAL HISTORY:   Past Medical History:  Diagnosis Date  . Abnormal thyroid blood test 03/25/2013  . Anxiety   . Breech presentation 06/07/2016  . Chronic right-sided low back pain 09/18/2018  . Complication of anesthesia    review chart from last CS.  Level went high couldn't see,   . DDD (degenerative disc  disease), cervical   . Depression    post partum meds taken  . Dizzy spells 03/25/2013  . DVT (deep venous thrombosis) (HCC)    Left arm  . Hair loss 03/25/2013  . 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 Insomnia   . Lipoma of lower back 10/24/2017  . Medical history non-contributory   . Migraines 03/25/2013  . Paresthesia of right foot 08/08/2018  . Postpartum care following cesarean delivery (9/5) 06/07/2016  . Previous cesarean delivery affecting pregnancy 05/17/2020  . Previous cesarean section 05/17/2020  . Ptosis of left eyelid 12/08/2014  . Status post repeat low transverse cesarean section 8/15 05/18/2020    PAST SURGICAL HISTORY:   Past Surgical History:  Procedure Laterality Date  . BACK SURGERY     lipoma removal  . CESAREAN SECTION N/A 06/07/2016   Procedure: PRIMARY CESAREAN SECTION - BREECH PRESENTATION;  Surgeon: Brien Few, MD;  Location: [facility];  Service: Obstetrics;  Laterality: N/A;  EDD 06/07/2016  . CESAREAN SECTION N/A 05/17/2020   Procedure: Repeat CESAREAN SECTION;  Surgeon: Brien Few, MD;  Location: [facility];  Service: Obstetrics;  Laterality: N/A;  EDD: 05/23/20  . TONSILLECTOMY      CURRENT MEDICATIONS:   Current Outpatient Medications  Medication Sig Dispense Refill  . buPROPion (WELLBUTRIN XL) 300 MG 24 hr tablet Take 300 mg by mouth daily.    Marland Kitchen desvenlafaxine (PRISTIQ) 50 MG 24 hr tablet Take 50 mg by mouth daily.    Marland Kitchen  gabapentin (NEURONTIN) 300 MG capsule Take 300 mg by mouth at bedtime as needed.    . metoprolol succinate (TOPROL XL) 25 MG 24 hr tablet Take 0.5 tablets (12.5 mg total) by mouth daily. 45 tablet 3  . oxybutynin (DITROPAN-XL) 10 MG 24 hr tablet Take 10 mg by mouth at bedtime.     No current facility-administered medications for this visit.    ALLERGIES:  No Known Allergies  FAMILY HISTORY:   Family History  Problem Relation Age of Onset  . Hyperlipidemia Father   . Hypertension Father   . Diabetes Father   . Thyroid disease  Father   . COPD Father   . Heart attack Maternal Grandmother   . Diabetes Maternal Grandmother   . Heart disease Paternal Grandfather   . Diabetes Paternal Grandfather   . Osteoarthritis Mother   . Hypertension Mother   . Hyperlipidemia Mother     SOCIAL HISTORY:  The patient was born and raised in [place].  She lives in [place] with her husband of 6 years.  She has 2 children.  She has been a Marine scientist for 16 years.  There is no history of alcoholism or tobacco abuse.    REVIEW OF SYSTEMS:  Review of Systems  Constitutional: Positive for fatigue. Negative for fever.  HENT:   Negative for hearing loss and sore throat.   Eyes: Positive for eye problems (blurry vision).  Respiratory: Positive for cough and shortness of breath. Negative for chest tightness and hemoptysis.   Cardiovascular: Positive for palpitations. Negative for chest pain.  Gastrointestinal: Positive for nausea. Negative for abdominal distention, abdominal pain, blood in stool, constipation, diarrhea and vomiting.  Endocrine: Negative for hot flashes.  Genitourinary: Negative for difficulty urinating, dysuria, frequency, hematuria and nocturia.   Musculoskeletal: Negative for arthralgias, back pain, gait problem and myalgias.  Skin: Negative.  Negative for itching and rash.  Neurological: Positive for headaches. Negative for dizziness, extremity weakness, gait problem, light-headedness and numbness.  Hematological: Negative.   Psychiatric/Behavioral: Positive for depression. Negative for suicidal ideas. The patient is nervous/anxious.      PHYSICAL EXAM:  unknown if currently breastfeeding. Wt Readings from Last 3 Encounters:  01/11/21 164 lb 9.6 oz (74.7 kg)  12/31/20 162 lb 9.6 oz (73.8 kg)  05/04/20 188 lb (85.3 kg)   There is no height or weight on file to calculate BMI. Performance status (ECOG): 0 Physical Exam Constitutional:      Appearance: Normal appearance. She is not ill-appearing.  HENT:      Mouth/Throat:     Mouth: Mucous membranes are moist.     Pharynx: Oropharynx is clear. No oropharyngeal exudate or posterior oropharyngeal erythema.  Cardiovascular:     Rate and Rhythm: Normal rate and regular rhythm.     Heart sounds: No murmur heard. No friction rub. No gallop.   Pulmonary:     Effort: Pulmonary effort is normal. No respiratory distress.     Breath sounds: Normal breath sounds. No wheezing, rhonchi or rales.  Chest:  Breasts:     Right: No axillary adenopathy or supraclavicular adenopathy.     Left: No axillary adenopathy or supraclavicular adenopathy.    Abdominal:     General: Bowel sounds are normal. There is no distension.     Palpations: Abdomen is soft. There is no mass.     Tenderness: There is no abdominal tenderness.  Musculoskeletal:        General: No swelling.     Right lower  leg: No edema.     Left lower leg: No edema.  Lymphadenopathy:     Cervical: No cervical adenopathy.     Upper Body:     Right upper body: No supraclavicular or axillary adenopathy.     Left upper body: No supraclavicular or axillary adenopathy.     Lower Body: No right inguinal adenopathy. No left inguinal adenopathy.  Skin:    General: Skin is warm.     Coloration: Skin is not jaundiced.     Findings: No lesion or rash.  Neurological:     General: No focal deficit present.     Mental Status: She is alert and oriented to person, place, and time. Mental status is at baseline.     Cranial Nerves: Cranial nerves are intact.  Psychiatric:        Mood and Affect: Mood normal.        Behavior: Behavior normal.        Thought Content: Thought content normal.   SCANS:  Her Doppler ultrasound in September 2021 reported the following: FINDINGS: Contralateral Subclavian Vein: Respiratory phasicity is normal and symmetric with the symptomatic side. No evidence of thrombus. Normal compressibility.  Internal Jugular Vein: No evidence of thrombus. Normal compressibility,  respiratory phasicity and response to augmentation.  Subclavian Vein: No evidence of thrombus. Normal compressibility, respiratory phasicity and response to augmentation.  Axillary Vein: No evidence of thrombus. Normal compressibility, respiratory phasicity and response to augmentation.  Cephalic Vein: There is a nonocclusive thrombus involving the distal cephalic vein at the level of the wrist.  Basilic Vein: No evidence of thrombus. Normal compressibility, respiratory phasicity and response to augmentation.  Brachial Veins: No evidence of thrombus. Normal compressibility, respiratory phasicity and response to augmentation.  Radial Veins: No evidence of thrombus. Normal compressibility, respiratory phasicity and response to augmentation.  Ulnar Veins: No evidence of thrombus. Normal compressibility, respiratory phasicity and response to augmentation.  Venous Reflux:  None visualized.  Other Findings:  None  IMPRESSION: Study is positive for an acute DVT at the level of the distal cephalic vein.  STUDIES:  ECHOCARDIOGRAM COMPLETE  Result Date: 02/04/2021    ECHOCARDIOGRAM REPORT   Patient Name:   TAKAYA HYSLOP Date of Exam: 02/04/2021 Medical Rec #:  [id number]     Height:       66.0 in Accession #:    [id number]    Weight:       164.6 lb Date of Birth:  1981/01/16     BSA:          1.841 m Patient Age:    39 years      BP:           100/62 mmHg Patient Gender: F             HR:           76 bpm. Exam Location:  [facility] Procedure: 2D Echo Indications:    Shortness of breath [R06.02 (ICD-10-CM)]  History:        Patient has no prior history of Echocardiogram examinations. DVT                 (deep venous thrombosis);                 Signs/Symptoms:Dizziness/Lightheadedness.  Sonographer:    Luane School Referring Phys: [id number] [place]  1. Left ventricular ejection fraction, by estimation, is 60 to 65%. The left ventricle has normal function. The left ventricle has no  regional wall motion abnormalities. Left ventricular diastolic parameters were normal.  2. Right ventricular systolic function is normal. The right ventricular size is normal. There is normal pulmonary artery systolic pressure.  3. The mitral valve is normal in structure. No evidence of mitral valve regurgitation. No evidence of mitral stenosis.  4. The aortic valve is normal in structure. Aortic valve regurgitation is not visualized. No aortic stenosis is present.  5. The inferior vena cava is normal in size with greater than 50% respiratory variability, suggesting right atrial pressure of 3 mmHg. FINDINGS  Left Ventricle: Left ventricular ejection fraction, by estimation, is 60 to 65%. The left ventricle has normal function. The left ventricle has no regional wall motion abnormalities. Global longitudinal strain performed but not reported based on interpreter judgement due to suboptimal tracking. The left ventricular internal cavity size was normal in size. There is no left ventricular hypertrophy. Left ventricular diastolic parameters were normal. Right Ventricle: The right ventricular size is normal. No increase in right ventricular wall thickness. Right ventricular systolic function is normal. There is normal pulmonary artery systolic pressure. The tricuspid regurgitant velocity is 1.91 m/s, and  with an assumed right atrial pressure of 3 mmHg, the estimated right ventricular systolic pressure is [id number] mmHg. Left Atrium: Left atrial size was normal in size. Right Atrium: Right atrial size was normal in size. Pericardium: There is no evidence of pericardial effusion. Mitral Valve: The mitral valve is normal in structure. No evidence of mitral valve regurgitation. No evidence of mitral valve stenosis. Tricuspid Valve: The tricuspid valve is normal in structure. Tricuspid valve regurgitation is not demonstrated. No evidence of tricuspid stenosis. Aortic Valve: The aortic valve is normal in structure. Aortic valve  regurgitation is not visualized. No aortic stenosis is present. Pulmonic Valve: The pulmonic valve was normal in structure. Pulmonic valve regurgitation is not visualized. No evidence of pulmonic stenosis. Aorta: The aortic root is normal in size and structure. Venous: The inferior vena cava is normal in size with greater than 50% respiratory variability, suggesting right atrial pressure of 3 mmHg. IAS/Shunts: No atrial level shunt detected by color flow Doppler.  LEFT VENTRICLE PLAX 2D LVIDd:         4.50 cm  Diastology LVIDs:         3.10 cm  LV e' medial:    12.20 cm/s LV PW:         0.80 cm  LV E/e' medial:  5.5 LV IVS:        0.90 cm  LV e' lateral:   12.00 cm/s LVOT diam:     2.10 cm  LV E/e' lateral: 5.6 LV SV:         38 LV SV Index:   21 LVOT Area:     3.46 cm  RIGHT VENTRICLE             IVC RV S prime:     10.90 cm/s  IVC diam: 1.30 cm TAPSE (M-mode): 2.2 cm LEFT ATRIUM             Index       RIGHT ATRIUM          Index LA diam:        3.20 cm 1.74 cm/m  RA Area:     9.06 cm LA Vol (A2C):   33.4 ml 18.14 ml/m RA Volume:   16.90 ml 9.18 ml/m LA Vol (A4C):   20.7 ml 11.24 ml/m LA Biplane Vol: 26.1 ml 14.18 ml/m  AORTIC VALVE LVOT Vmax:   52.20 cm/s LVOT Vmean:  33.500 cm/s LVOT VTI:    0.109 m  AORTA Ao Root diam: 2.80 cm Ao Asc diam:  2.90 cm Ao Desc diam: 1.80 cm MITRAL VALVE               TRICUSPID VALVE MV Area (PHT): 3.36 cm    TR Peak grad:   14.6 mmHg MV Decel Time: 226 msec    TR Vmax:        191.00 cm/s MV E velocity: 66.80 cm/s MV A velocity: 51.00 cm/s  SHUNTS MV E/A ratio:  1.31        Systemic VTI:  0.11 m                            Systemic Diam: 2.10 cm Kardie Tobb DO Electronically signed by Berniece Salines DO Signature Date/Time: 02/04/2021/12:42:47 PM    Final    US THYROID  Result Date: 01/15/2021 CLINICAL DATA:  Prior ultrasound demonstrated a hypoechoic nodule in left thyroid lobe. Patient presents for ultrasound-guided fine-needle aspiration of the nodule. EXAM: THYROID  ULTRASOUND TECHNIQUE: Ultrasound examination of the thyroid gland and adjacent soft tissues was performed. COMPARISON:  Thyroid ultrasound 11/24/2020 FINDINGS: The area of concern is along the posterior aspect of the left thyroid lobe. This area was thoroughly evaluated with more than one ultrasound transducer. 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 This area is slightly hypoechoic to the surrounding parenchyma on the longitudinal images but cannot be reproduced on the transverse images. No discrete nodule identified in the left thyroid lobe. IMPRESSION: No discrete left thyroid nodule. 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 Biopsy was not performed. Electronically Signed   By: Markus Daft M.D.   On: 01/15/2021 12:34   LONG TERM MONITOR (3-14 DAYS)  Result Date: 02/08/2021 Patch Wear Time:  14 days and 0 hours starting Derhonda 16, 2022 Indication: Palpitations Patient had a min HR of 50 bpm, max HR of 155 bpm, and avg HR of 80 bpm. Premature atrial complexes were rare. Premature ventricular complexes were frequent (5.9%, [id number]).  Ventricular Bigeminy and Trigeminy were present. 7 patient triggered events and 7 diary events are associated with premature ventricular complex by No ventricular tachycardia, no pauses, no atrial fibrillation. The study is remarkable for frequent symptomatic premature ventricular complexes.     ASSESSMENT & PLAN:  A 40 y.o. female who I was asked to consult upon for a clot in her cephalic vein.  Most of the visit today was spent explaining to the patient that a clot in the cephalic vein is a superficial (not a deep vein) thrombosis.  Furthermore, it was in the distal aspects of her cephalic vein.  I personally would have never placed her on Xarelto.  NSAIDs, ice and other supportive measures may have been all she needed for her superficial clot.  As mentioned previously, this patient never had  prior clots.  Furthermore, the superficial clot she had was while she was in the postpartum period, which is a thrombogenic state in one's life. It was also exacerbated by the fact that a peripheral IV was placed in that particular vein.  As she has definable reasons behind her superficial clot, there is no reason to run any type of hypercoagulable work.  I would only consider doing such  tests if she:  1) Developed an actual DVT 2) The DVT developed in an unprovoked manner  As she has no other pressing hematologic issues, I do feel comfortable turning her care back over to her other physicians.  I would not have a problem seeing her in the future if new hematologic issues arise that require repeat clinical assessment.  The patient understands all the plans discussed today and is in agreement with them.  I do appreciate Tobb, Kardie, DO for his new consult.   Kima Malenfant Macarthur Critchley, MD

## 2021-02-09 ENCOUNTER — Inpatient Hospital Stay: Payer: Managed Care, Other (non HMO) | Attending: Oncology | Admitting: Oncology

## 2021-02-09 ENCOUNTER — Other Ambulatory Visit: Payer: Self-pay

## 2021-02-09 DIAGNOSIS — R059 Cough, unspecified: Secondary | ICD-10-CM

## 2021-02-09 DIAGNOSIS — R0602 Shortness of breath: Secondary | ICD-10-CM | POA: Diagnosis not present

## 2021-02-09 DIAGNOSIS — Z8249 Family history of ischemic heart disease and other diseases of the circulatory system: Secondary | ICD-10-CM

## 2021-02-09 DIAGNOSIS — Z8261 Family history of arthritis: Secondary | ICD-10-CM

## 2021-02-09 DIAGNOSIS — R110 Nausea: Secondary | ICD-10-CM

## 2021-02-09 DIAGNOSIS — R519 Headache, unspecified: Secondary | ICD-10-CM

## 2021-02-09 DIAGNOSIS — Z8349 Family history of other endocrine, nutritional and metabolic diseases: Secondary | ICD-10-CM

## 2021-02-09 DIAGNOSIS — Z833 Family history of diabetes mellitus: Secondary | ICD-10-CM

## 2021-02-09 DIAGNOSIS — R5383 Other fatigue: Secondary | ICD-10-CM

## 2021-02-09 DIAGNOSIS — F32A Depression, unspecified: Secondary | ICD-10-CM

## 2021-02-09 DIAGNOSIS — Z79899 Other long term (current) drug therapy: Secondary | ICD-10-CM

## 2021-02-09 DIAGNOSIS — I82619 Acute embolism and thrombosis of superficial veins of unspecified upper extremity: Secondary | ICD-10-CM

## 2021-02-09 DIAGNOSIS — F419 Anxiety disorder, unspecified: Secondary | ICD-10-CM

## 2021-02-09 DIAGNOSIS — Z836 Family history of other diseases of the respiratory system: Secondary | ICD-10-CM

## 2021-02-10 ENCOUNTER — Telehealth: Payer: Self-pay | Admitting: Oncology

## 2021-02-10 NOTE — Telephone Encounter (Signed)
No LOS Entered 

## 2021-02-26 ENCOUNTER — Telehealth: Payer: Self-pay | Admitting: Cardiology

## 2021-02-26 ENCOUNTER — Other Ambulatory Visit: Payer: Self-pay

## 2021-02-26 DIAGNOSIS — R42 Dizziness and giddiness: Secondary | ICD-10-CM

## 2021-02-26 NOTE — Progress Notes (Signed)
Patient is to come next Wednesday (61/2022) to get an EKG per Dr. Harriet Masson.

## 2021-02-26 NOTE — Telephone Encounter (Signed)
Sent patient a MyChart message with recommendations from Dr. Harriet Masson to hold medication and record her blood pressure daily.

## 2021-02-26 NOTE — Telephone Encounter (Signed)
Spoke with patient about her symptoms. She states I've had these episodes 3 times this week. I feel lightheaded, nausea, overall not feeling well. I had this feelings when I had my monitor on but I have not had this feeling since being on the medication until this week. The first episode was Monday, then Wednesday , and now  an 1 hour and a half today. When I checked my blood pressure it was 92/62 but I run in the 90s so that is normal for me. Will relay information to Dr. Harriet Masson. Patient verbalizes understanding. No further questions or concerns at this time.

## 2021-02-26 NOTE — Telephone Encounter (Signed)
Pt c/o medication issue: 1. Name of Medication: Metoprolol  2. How are you currently taking this medication (dosage and times per day)? Once a day 3. Are you having a reaction (difficulty breathing--STAT)?  Some time sick to her stomack 4. What is your medication issue? Patient do not think this medication is working she is not feeling good and lightheaded.

## 2021-03-03 ENCOUNTER — Encounter: Payer: Self-pay | Admitting: Cardiology

## 2021-03-03 ENCOUNTER — Other Ambulatory Visit: Payer: Self-pay

## 2021-03-03 ENCOUNTER — Ambulatory Visit (INDEPENDENT_AMBULATORY_CARE_PROVIDER_SITE_OTHER): Payer: Managed Care, Other (non HMO)

## 2021-03-03 ENCOUNTER — Ambulatory Visit (INDEPENDENT_AMBULATORY_CARE_PROVIDER_SITE_OTHER): Payer: Managed Care, Other (non HMO) | Admitting: Cardiology

## 2021-03-03 VITALS — BP 98/62 | HR 74 | Ht 66.0 in | Wt 165.0 lb

## 2021-03-03 VITALS — BP 98/62 | HR 74 | Wt 165.0 lb

## 2021-03-03 DIAGNOSIS — R002 Palpitations: Secondary | ICD-10-CM

## 2021-03-03 DIAGNOSIS — I493 Ventricular premature depolarization: Secondary | ICD-10-CM

## 2021-03-03 DIAGNOSIS — R5383 Other fatigue: Secondary | ICD-10-CM

## 2021-03-03 MED ORDER — FLECAINIDE ACETATE 50 MG PO TABS
50.0000 mg | ORAL_TABLET | Freq: Two times a day (BID) | ORAL | 1 refills | Status: DC
Start: 2021-03-03 — End: 2021-04-15

## 2021-03-03 NOTE — Progress Notes (Signed)
Cardiology Office Note:    Date:  03/03/2021   ID:  Deborah Petty, Deborah Petty 1981/05/19, MRN [id number]  PCP:  Ernestene Kiel, MD  Cardiologist:  Berniece Salines, DO  Electrophysiologist:  None   Referring MD: Ernestene Kiel, MD   Chief Complaint  Patient presents with  . Shortness of Breath  . Fatigue    History of Present Illness:    Deborah Petty is a 40 y.o. female with a hx of DVT 6 weeks after delivery in August 2021 was treated with Xarelto for 4 months and then stop, recently diagnosed thyroid nodule... The patient presented presented to be evaluated for palpitation and shortness of breath.  During her initial visit we will get an echocardiogram as well I placed a monitor on the patient.  She was also referred to hematology giving history of DVT post pregnancy and female with Xarelto.  In the interim she did get her testing done.  She had significant frequent symptomatic PVCs with that she was started on low-dose beta-blocker.  The patient is here today she tells me she has been fatigue and has also been experiencing more palpitations.   Past Medical History:  Diagnosis Date  . Abnormal thyroid blood test 03/25/2013  . Anxiety   . Breech presentation 06/07/2016  . Chronic right-sided low back pain 09/18/2018  . Complication of anesthesia    review chart from last CS.  Level went high couldn't see,   . DDD (degenerative disc disease), cervical   . Depression    post partum meds taken  . Dizzy spells 03/25/2013  . DVT (deep venous thrombosis) (HCC)    Left arm  . Hair loss 03/25/2013  . 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 Insomnia   . Lipoma of lower back 10/24/2017  . Medical history non-contributory   . Migraines 03/25/2013  . Paresthesia of right foot 08/08/2018  . Postpartum care following cesarean delivery (9/5) 06/07/2016  . Previous cesarean delivery affecting pregnancy 05/17/2020  . Previous cesarean section 05/17/2020  . Ptosis of left eyelid 12/08/2014  . Status post repeat low  transverse cesarean section 8/15 05/18/2020    Past Surgical History:  Procedure Laterality Date  . BACK SURGERY     lipoma removal  . CESAREAN SECTION N/A 06/07/2016   Procedure: PRIMARY CESAREAN SECTION - BREECH PRESENTATION;  Surgeon: Brien Few, MD;  Location: [facility];  Service: Obstetrics;  Laterality: N/A;  EDD 06/07/2016  . CESAREAN SECTION N/A 05/17/2020   Procedure: Repeat CESAREAN SECTION;  Surgeon: Brien Few, MD;  Location: [place] [facility] [facility];  Service: Obstetrics;  Laterality: N/A;  EDD: 05/23/20  . TONSILLECTOMY      Current Medications: Current Meds  Medication Sig  . buPROPion (WELLBUTRIN XL) 300 MG 24 hr tablet Take 300 mg by mouth daily.  Marland Kitchen desvenlafaxine (PRISTIQ) 50 MG 24 hr tablet Take 50 mg by mouth daily.  . flecainide (TAMBOCOR) 50 MG tablet Take 1 tablet (50 mg total) by mouth 2 (two) times daily.  Marland Kitchen gabapentin (NEURONTIN) 300 MG capsule Take 300 mg by mouth at bedtime as needed (nerve pain).  . metoprolol succinate (TOPROL XL) 25 MG 24 hr tablet Take 0.5 tablets (12.5 mg total) by mouth daily.  Marland Kitchen oxybutynin (DITROPAN-XL) 10 MG 24 hr tablet Take 10 mg by mouth at bedtime.     Allergies:   Patient has no known allergies.   Social History   Socioeconomic History  . Marital status: Married    Spouse name: Not on file  .  Number of children: Not on file  . Years of education: Not on file  . Highest education level: Not on file  Occupational History  . Not on file  Tobacco Use  . Smoking status: Never Smoker  . Smokeless tobacco: Never Used  Substance and Sexual Activity  . Alcohol use: Not Currently    Comment: socially  . Drug use: No  . Sexual activity: Yes    Birth control/protection: None  Other Topics Concern  . Not on file  Social History Narrative   ** Merged History Encounter **       Social Determinants of Health   Financial Resource Strain: Not on file  Food Insecurity: Not on file  Transportation Needs: Not on file  Physical  Activity: Not on file  Stress: Not on file  Social Connections: Not on file     Family History: The patient's family history includes COPD in her father; Diabetes in her father, maternal grandmother, and paternal grandfather; Heart attack in her maternal grandmother; Heart disease in her paternal grandfather; Hyperlipidemia in her father and mother; Hypertension in her father and mother; Osteoarthritis in her mother; Thyroid disease in her father.  ROS:   Review of Systems  Constitution: Negative for decreased appetite, fever and weight gain.  HENT: Negative for congestion, ear discharge, hoarse voice and sore throat.   Eyes: Negative for discharge, redness, vision loss in right eye and visual halos.  Cardiovascular: Negative for chest pain, dyspnea on exertion, leg swelling, orthopnea and palpitations.  Respiratory: Negative for cough, hemoptysis, shortness of breath and snoring.   Endocrine: Negative for heat intolerance and polyphagia.  Hematologic/Lymphatic: Negative for bleeding problem. Does not bruise/bleed easily.  Skin: Negative for flushing, nail changes, rash and suspicious lesions.  Musculoskeletal: Negative for arthritis, joint pain, muscle cramps, myalgias, neck pain and stiffness.  Gastrointestinal: Negative for abdominal pain, bowel incontinence, diarrhea and excessive appetite.  Genitourinary: Negative for decreased libido, genital sores and incomplete emptying.  Neurological: Negative for brief paralysis, focal weakness, headaches and loss of balance.  Psychiatric/Behavioral: Negative for altered mental status, depression and suicidal ideas.  Allergic/Immunologic: Negative for HIV exposure and persistent infections.    EKGs/Labs/Other Studies Reviewed:    The following studies were reviewed today:   EKG:  The ekg ordered today demonstrates sinus rhythm heart rate 68 bpm with incomplete right bundle branch block  ZIO monitor Patch Wear Time:  14 days and 0 hours  starting Madailein 16, 2022 Indication: Palpitations  Patient had a min HR of 50 bpm, max HR of 155 bpm, and avg HR of 80 bpm.   Premature atrial complexes were rare. Premature ventricular complexes were frequent (5.9%, 91703).  Ventricular Bigeminy and Trigeminy were present.   7 patient triggered events and 7 diary events are associated with premature ventricular complex by  No ventricular tachycardia, no pauses, no atrial fibrillation.  The study is remarkable for frequent symptomatic premature ventricular complexes.   Transthoracic echocardiogram Feb 04, 2021 IMPRESSIONS  1. Left ventricular ejection fraction, by estimation, is 60 to 65%. The  left ventricle has normal function. The left ventricle has no regional  wall motion abnormalities. Left ventricular diastolic parameters were  normal.  2. Right ventricular systolic function is normal. The right ventricular  size is normal. There is normal pulmonary artery systolic pressure.  3. The mitral valve is normal in structure. No evidence of mitral valve  regurgitation. No evidence of mitral stenosis.  4. The aortic valve is normal in structure.  Aortic valve regurgitation is  not visualized. No aortic stenosis is present.  5. The inferior vena cava is normal in size with greater than 50%  respiratory variability, suggesting right atrial pressure of 3 mmHg  Recent Labs: 05/18/2020: Hemoglobin 10.6; Platelets 188 01/14/2021: TSH 1.36  Recent Lipid Panel No results found for: CHOL, TRIG, HDL, CHOLHDL, VLDL, LDLCALC, LDLDIRECT  Physical Exam:    VS:  BP 98/62 (BP Location: Right Arm, Patient Position: Sitting)   Pulse 74   Ht 5\' 6"  (1.676 m)   Wt 165 lb (74.8 kg)   SpO2 98%   BMI 26.63 kg/m     Wt Readings from Last 3 Encounters:  03/03/21 165 lb (74.8 kg)  03/03/21 165 lb (74.8 kg)  02/09/21 164 lb 11.2 oz (74.7 kg)     GEN: Well nourished, well developed in no acute distress HEENT: Normal NECK: No JVD; No  carotid bruits LYMPHATICS: No lymphadenopathy CARDIAC: S1S2 noted,RRR, no murmurs, rubs, gallops RESPIRATORY:  Clear to auscultation without rales, wheezing or rhonchi  ABDOMEN: Soft, non-tender, non-distended, +bowel sounds, no guarding. EXTREMITIES: No edema, No cyanosis, no clubbing MUSCULOSKELETAL:  No deformity  SKIN: Warm and dry NEUROLOGIC:  Alert and oriented x 3, non-focal PSYCHIATRIC:  Normal affect, good insight  ASSESSMENT:    1. Frequent symptomatic PVCs   2. Fatigue, unspecified type   3. Palpitations    PLAN:     She is on low-dose beta-blocker and is still experiencing palpitations and skipped beats.  I will add flecainide 50 mg twice a day to her medication regimen.  She will come back in 1 week to get EKG done.  In the meantime we will get vitamin D levels as well as B12 for her fatigue.   The patient is in agreement with the above plan. The patient left the office in stable condition.  The patient will follow up in 2 weeks.   Medication Adjustments/Labs and Tests Ordered: Current medicines are reviewed at length with the patient today.  Concerns regarding medicines are outlined above.  Orders Placed This Encounter  Procedures  . Vitamin D 1,25 dihydroxy  . B12  . EKG 12-Lead   Meds ordered this encounter  Medications  . flecainide (TAMBOCOR) 50 MG tablet    Sig: Take 1 tablet (50 mg total) by mouth 2 (two) times daily.    Dispense:  60 tablet    Refill:  1    Patient Instructions  Medication Instructions:  Your physician has recommended you make the following change in your medication: Start Flecainide 50mg  one tablet twice daily and continue taking Metoprolol as prescribed    *If you need a refill on your cardiac medications before your next appointment, please call your pharmacy*   Lab Work: Your physician recommends that you return for lab work in: Vitamin D and Vitamin B12  If you have labs (blood work) drawn today and your tests are  completely normal, you will receive your results only by: Marland Kitchen MyChart Message (if you have MyChart) OR . A paper copy in the mail If you have any lab test that is abnormal or we need to change your treatment, we will call you to review the results.   Testing/Procedures: EKG: to be schedule in one week from today   Follow-Up: At [facility], you and your health needs are our priority.  As part of our continuing mission to provide you with exceptional heart care, we have created designated Provider Care Teams.  These  Care Teams include your primary Cardiologist (physician) and Advanced Practice Providers (APPs -  Physician Assistants and Nurse Practitioners) who all work together to provide you with the care you need, when you need it.  We recommend signing up for the patient portal called "MyChart".  Sign up information is provided on this After Visit Summary.  MyChart is used to connect with patients for Virtual Visits (Telemedicine).  Patients are able to view lab/test results, encounter notes, upcoming appointments, etc.  Non-urgent messages can be sent to your provider as well.   To learn more about what you can do with MyChart, go to [email or web address].    Your next appointment:   2 week(s)  The format for your next appointment:   In Person  Provider:   Berniece Salines, DO   Other Instructions      Adopting a Healthy Lifestyle.  Know what a healthy weight is for you (roughly BMI <25) and aim to maintain this   Aim for 7+ servings of fruits and vegetables daily   65-80+ fluid ounces of water or unsweet tea for healthy kidneys   Limit to max 1 drink of alcohol per day; avoid smoking/tobacco   Limit animal fats in diet for cholesterol and heart health - choose grass fed whenever available   Avoid highly processed foods, and foods high in saturated/trans fats   Aim for low stress - take time to unwind and care for your mental health   Aim for 150 min of moderate  intensity exercise weekly for heart health, and weights twice weekly for bone health   Aim for 7-9 hours of sleep daily   When it comes to diets, agreement about the perfect plan isnt easy to find, even among the experts. Experts at the [employer] developed an idea known as the Healthy Eating Plate. Just imagine a plate divided into logical, healthy portions.   The emphasis is on diet quality:   Load up on vegetables and fruits - one-half of your plate: Aim for color and variety, and remember that potatoes dont count.   Go for whole grains - one-quarter of your plate: Whole wheat, barley, wheat berries, quinoa, oats, brown rice, and foods made with them. If you want pasta, go with whole wheat pasta.   Protein power - one-quarter of your plate: Fish, chicken, beans, and nuts are all healthy, versatile protein sources. Limit red meat.   The diet, however, does go beyond the plate, offering a few other suggestions.   Use healthy plant oils, such as olive, canola, soy, corn, sunflower and peanut. Check the labels, and avoid partially hydrogenated oil, which have unhealthy trans fats.   If youre thirsty, drink water. Coffee and tea are good in moderation, but skip sugary drinks and limit milk and dairy products to one or two daily servings.   The type of carbohydrate in the diet is more important than the amount. Some sources of carbohydrates, such as vegetables, fruits, whole grains, and beans-are healthier than others.   Finally, stay active  Signed, Berniece Salines, DO  03/03/2021 9:25 AM    [place]

## 2021-03-03 NOTE — Addendum Note (Signed)
Addended by: Birder Robson on: 03/03/2021 04:05 PM   Modules accepted: Orders

## 2021-03-03 NOTE — Patient Instructions (Signed)
Medication Instructions:  Your physician has recommended you make the following change in your medication: Start Flecainide 50mg  one tablet twice daily and continue taking Metoprolol as prescribed    *If you need a refill on your cardiac medications before your next appointment, please call your pharmacy*   Lab Work: Your physician recommends that you return for lab work in: Vitamin D and Vitamin B12  If you have labs (blood work) drawn today and your tests are completely normal, you will receive your results only by: Marland Kitchen MyChart Message (if you have MyChart) OR . A paper copy in the mail If you have any lab test that is abnormal or we need to change your treatment, we will call you to review the results.   Testing/Procedures: EKG: to be schedule in one week from today   Follow-Up: At [facility], you and your health needs are our priority.  As part of our continuing mission to provide you with exceptional heart care, we have created designated Provider Care Teams.  These Care Teams include your primary Cardiologist (physician) and Advanced Practice Providers (APPs -  Physician Assistants and Nurse Practitioners) who all work together to provide you with the care you need, when you need it.  We recommend signing up for the patient portal called "MyChart".  Sign up information is provided on this After Visit Summary.  MyChart is used to connect with patients for Virtual Visits (Telemedicine).  Patients are able to view lab/test results, encounter notes, upcoming appointments, etc.  Non-urgent messages can be sent to your provider as well.   To learn more about what you can do with MyChart, go to [email or web address].    Your next appointment:   2 week(s)  The format for your next appointment:   In Person  Provider:   Berniece Salines, DO   Other Instructions

## 2021-03-10 ENCOUNTER — Ambulatory Visit (INDEPENDENT_AMBULATORY_CARE_PROVIDER_SITE_OTHER): Payer: Managed Care, Other (non HMO)

## 2021-03-10 ENCOUNTER — Other Ambulatory Visit: Payer: Self-pay

## 2021-03-10 VITALS — BP 96/60 | HR 76 | Ht 66.0 in | Wt 165.4 lb

## 2021-03-10 DIAGNOSIS — Z79899 Other long term (current) drug therapy: Secondary | ICD-10-CM | POA: Diagnosis not present

## 2021-03-10 DIAGNOSIS — I493 Ventricular premature depolarization: Secondary | ICD-10-CM | POA: Diagnosis not present

## 2021-03-10 DIAGNOSIS — R002 Palpitations: Secondary | ICD-10-CM

## 2021-03-10 NOTE — Progress Notes (Signed)
Patient came in for one week EKG check after taking flecainide for the previous week. Dr. Bettina Gavia reviewed the EKG and states that it is normal and the patient is good to leave the office.

## 2021-03-10 NOTE — Addendum Note (Signed)
Addended by: Zaylyn Bergdoll, Jonelle Sidle L on: 03/10/2021 09:01 AM   Modules accepted: Orders

## 2021-03-11 LAB — VITAMIN B12: Vitamin B-12: 658 pg/mL (ref 232–1245)

## 2021-03-11 LAB — VITAMIN D 1,25 DIHYDROXY
Vitamin D 1, 25 (OH)2 Total: 61 pg/mL
Vitamin D2 1, 25 (OH)2: <10 pg/mL
Vitamin D3 1, 25 (OH)2: 61 pg/mL

## 2021-03-15 ENCOUNTER — Telehealth: Payer: Self-pay

## 2021-03-15 NOTE — Telephone Encounter (Signed)
Spoke with patient about lab results. Patient verbalizes understanding. No questions or concerns expressed at this time.

## 2021-03-16 ENCOUNTER — Other Ambulatory Visit: Payer: Self-pay

## 2021-03-16 ENCOUNTER — Encounter: Payer: Self-pay | Admitting: Cardiology

## 2021-03-16 ENCOUNTER — Ambulatory Visit (INDEPENDENT_AMBULATORY_CARE_PROVIDER_SITE_OTHER): Payer: Managed Care, Other (non HMO) | Admitting: Cardiology

## 2021-03-16 VITALS — BP 120/62 | HR 68 | Ht 66.0 in | Wt 166.4 lb

## 2021-03-16 DIAGNOSIS — Z79899 Other long term (current) drug therapy: Secondary | ICD-10-CM

## 2021-03-16 DIAGNOSIS — I493 Ventricular premature depolarization: Secondary | ICD-10-CM

## 2021-03-16 NOTE — Patient Instructions (Signed)
Medication Instructions:  Your physician recommends that you continue on your current medications as directed. Please refer to the Current Medication list given to you today.  *If you need a refill on your cardiac medications before your next appointment, please call your pharmacy*   Lab Work: Your physician recommends that you return for lab work in:  TODAY: Flecainide level  If you have labs (blood work) drawn today and your tests are completely normal, you will receive your results only by: [place] (if you have MyChart) OR A paper copy in the mail If you have any lab test that is abnormal or we need to change your treatment, we will call you to review the results.   Testing/Procedures: None   Follow-Up: At [facility], you and your health needs are our priority.  As part of our continuing mission to provide you with exceptional heart care, we have created designated Provider Care Teams.  These Care Teams include your primary Cardiologist (physician) and Advanced Practice Providers (APPs -  Physician Assistants and Nurse Practitioners) who all work together to provide you with the care you need, when you need it.  We recommend signing up for the patient portal called "MyChart".  Sign up information is provided on this After Visit Summary.  MyChart is used to connect with patients for Virtual Visits (Telemedicine).  Patients are able to view lab/test results, encounter notes, upcoming appointments, etc.  Non-urgent messages can be sent to your provider as well.   To learn more about what you can do with MyChart, go to [email or web address].    Your next appointment:   1 year(s)  The format for your next appointment:   In Person  Provider:   [address]  -Berniece Salines, Do   Other Instructions

## 2021-03-16 NOTE — Progress Notes (Signed)
Cardiology Office Note:    Date:  03/16/2021   ID:  Deborah Petty, Schauer May 01, 1981, MRN [id number]  PCP:  Ernestene Kiel, MD  Cardiologist:  Berniece Salines, DO  Electrophysiologist:  None   Referring MD: Ernestene Kiel, MD   No chief complaint on file. I am doing well.  History of Present Illness:    Deborah Petty is a 40 y.o. female with a hx of DVT 6 weeks after delivery in August 2021 was treated with Xarelto for 4 months and then stop, recently diagnosed thyroid nodule... The patient presented presented to be evaluated for palpitation and shortness of breath.  During her initial visit we will get an echocardiogram as well I placed a monitor on the patient.  She was also referred to hematology giving history of DVT post pregnancy and female with Xarelto.  I saw the patient on March 03, 2021 at that time we discussed her monitor which showed significant symptomatic PVCs.  Prior to her visit she had been started on low-dose beta-blocker.  During that time she did tell me that she was still experiencing symptoms.  I started her on low-dose flecainide 50 mg twice a day.  She is here today for follow-up visit.  She tells me since she started the flecainide she has been doing well she has not had any symptoms.   Past Medical History:  Diagnosis Date   Abnormal thyroid blood test 03/25/2013   Anxiety    Breech presentation 06/07/2016   Chronic right-sided low back pain 32/35/5732   Complication of anesthesia    review chart from last CS.  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   Insomnia    Lipoma of lower back 10/24/2017   Medical history non-contributory    Migraines 03/25/2013   Paresthesia of right foot 08/08/2018   Postpartum care following cesarean delivery (9/5) 06/07/2016    Previous cesarean delivery affecting pregnancy 05/17/2020   Previous cesarean section 05/17/2020   Ptosis of left eyelid 12/08/2014   Status post repeat low transverse cesarean section 8/15 05/18/2020    Past Surgical History:  Procedure Laterality Date   BACK SURGERY     lipoma removal   CESAREAN SECTION N/A 06/07/2016   Procedure: PRIMARY CESAREAN SECTION - BREECH PRESENTATION;  Surgeon: Brien Few, MD;  Location: [place];  Service: Obstetrics;  Laterality: N/A;  EDD 06/07/2016   CESAREAN SECTION N/A 05/17/2020   Procedure: Repeat CESAREAN SECTION;  Surgeon: Brien Few, MD;  Location: [place] [facility];  Service: Obstetrics;  Laterality: N/A;  EDD: 05/23/20   TONSILLECTOMY      Current Medications: Current Meds  Medication Sig   buPROPion (WELLBUTRIN XL) 300 MG 24 hr tablet Take 300 mg by mouth daily.   desvenlafaxine (PRISTIQ) 50 MG 24 hr tablet Take 50 mg by mouth daily.   flecainide (TAMBOCOR) 50 MG tablet Take 1 tablet (50 mg total) by mouth 2 (two) times daily.   gabapentin (NEURONTIN) 300 MG capsule Take 300 mg by mouth at bedtime as needed (nerve pain).   metoprolol succinate (TOPROL XL) 25 MG 24 hr tablet Take 0.5 tablets (12.5 mg total) by mouth daily.   oxybutynin (DITROPAN-XL) 10 MG 24 hr tablet Take 10 mg by mouth at bedtime.  Allergies:   Patient has no known allergies.   Social History   Socioeconomic History   Marital status: Married    Spouse name: Not on file   Number of children: Not on file   Years of education: Not on file   Highest education level: Not on file  Occupational History   Not on file  Tobacco Use   Smoking status: Never   Smokeless tobacco: Never  Substance and Sexual Activity   Alcohol use: Not Currently    Comment: socially   Drug use: No   Sexual activity: Yes    Birth control/protection: None  Other Topics Concern   Not on file  Social History Narrative   ** Merged History Encounter **       Social Determinants of  Health   Financial Resource Strain: Not on file  Food Insecurity: Not on file  Transportation Needs: Not on file  Physical Activity: Not on file  Stress: Not on file  Social Connections: Not on file     Family History: The patient's family history includes COPD in her father; Diabetes in her father, maternal grandmother, and paternal grandfather; Heart attack in her maternal grandmother; Heart disease in her paternal grandfather; Hyperlipidemia in her father and mother; Hypertension in her father and mother; Osteoarthritis in her mother; Thyroid disease in her father.  ROS:   Review of Systems  Constitution: Negative for decreased appetite, fever and weight gain.  HENT: Negative for congestion, ear discharge, hoarse voice and sore throat.   Eyes: Negative for discharge, redness, vision loss in right eye and visual halos.  Cardiovascular: Negative for chest pain, dyspnea on exertion, leg swelling, orthopnea and palpitations.  Respiratory: Negative for cough, hemoptysis, shortness of breath and snoring.   Endocrine: Negative for heat intolerance and polyphagia.  Hematologic/Lymphatic: Negative for bleeding problem. Does not bruise/bleed easily.  Skin: Negative for flushing, nail changes, rash and suspicious lesions.  Musculoskeletal: Negative for arthritis, joint pain, muscle cramps, myalgias, neck pain and stiffness.  Gastrointestinal: Negative for abdominal pain, bowel incontinence, diarrhea and excessive appetite.  Genitourinary: Negative for decreased libido, genital sores and incomplete emptying.  Neurological: Negative for brief paralysis, focal weakness, headaches and loss of balance.  Psychiatric/Behavioral: Negative for altered mental status, depression and suicidal ideas.  Allergic/Immunologic: Negative for HIV exposure and persistent infections.    EKGs/Labs/Other Studies Reviewed:    The following studies were reviewed today:   EKG: None today  ZIO monitor Patch Wear  Time:  14 days and 0 hours starting Kelleen 16, 2022 Indication: Palpitations   Patient had a min HR of 50 bpm, max HR of 155 bpm, and avg HR of 80 bpm.   Premature atrial complexes were rare. Premature ventricular complexes were frequent (5.9%, 91703).  Ventricular Bigeminy and Trigeminy were present.   7 patient triggered events and 7 diary events are associated with premature ventricular complex by   No ventricular tachycardia, no pauses, no atrial fibrillation.   The study is remarkable for frequent symptomatic premature ventricular complexes.     Transthoracic echocardiogram Feb 04, 2021 IMPRESSIONS   1. Left ventricular ejection fraction, by estimation, is 60 to 65%. The  left ventricle has normal function. The left ventricle has no regional  wall motion abnormalities. Left ventricular diastolic parameters were  normal.   2. Right ventricular systolic function is normal. The right ventricular  size is normal. There is normal pulmonary artery systolic pressure.   3. The mitral valve is normal in structure.  No evidence of mitral valve  regurgitation. No evidence of mitral stenosis.   4. The aortic valve is normal in structure. Aortic valve regurgitation is  not visualized. No aortic stenosis is present.   5. The inferior vena cava is normal in size with greater than 50%  respiratory variability, suggesting right atrial pressure of 3 mmHg  Recent Labs: 05/18/2020: Hemoglobin 10.6; Platelets 188 01/14/2021: TSH 1.36  Recent Lipid Panel No results found for: CHOL, TRIG, HDL, CHOLHDL, VLDL, LDLCALC, LDLDIRECT  Physical Exam:    VS:  BP 120/62   Pulse 68   Ht 5\' 6"  (1.676 m)   Wt 166 lb 6.4 oz (75.5 kg)   SpO2 99%   BMI 26.86 kg/m     Wt Readings from Last 3 Encounters:  03/16/21 166 lb 6.4 oz (75.5 kg)  03/10/21 165 lb 6.4 oz (75 kg)  03/03/21 165 lb (74.8 kg)     GEN: Well nourished, well developed in no acute distress HEENT: Normal NECK: No JVD; No carotid  bruits LYMPHATICS: No lymphadenopathy CARDIAC: S1S2 noted,RRR, no murmurs, rubs, gallops RESPIRATORY:  Clear to auscultation without rales, wheezing or rhonchi  ABDOMEN: Soft, non-tender, non-distended, +bowel sounds, no guarding. EXTREMITIES: No edema, No cyanosis, no clubbing MUSCULOSKELETAL:  No deformity  SKIN: Warm and dry NEUROLOGIC:  Alert and oriented x 3, non-focal PSYCHIATRIC:  Normal affect, good insight  ASSESSMENT:    1. High risk medication use   2. Symptomatic PVCs    PLAN:     She is having on the dose of flecainide 50 mg twice a day and her metoprolol.  She is asymptomatic.  Therefore no changes will be made.  Flecainide level will be done in 6 months.  The patient is in agreement with the above plan. The patient left the office in stable condition.  The patient will follow up in 1 year   Medication Adjustments/Labs and Tests Ordered: Current medicines are reviewed at length with the patient today.  Concerns regarding medicines are outlined above.  Orders Placed This Encounter  Procedures   Flecainide level   No orders of the defined types were placed in this encounter.   Patient Instructions  Medication Instructions:  Your physician recommends that you continue on your current medications as directed. Please refer to the Current Medication list given to you today.  *If you need a refill on your cardiac medications before your next appointment, please call your pharmacy*   Lab Work: Your physician recommends that you return for lab work in:  TODAY: Flecainide level  If you have labs (blood work) drawn today and your tests are completely normal, you will receive your results only by: [place] (if you have MyChart) OR A paper copy in the mail If you have any lab test that is abnormal or we need to change your treatment, we will call you to review the results.   Testing/Procedures: None   Follow-Up: At [facility], you and your health  needs are our priority.  As part of our continuing mission to provide you with exceptional heart care, we have created designated Provider Care Teams.  These Care Teams include your primary Cardiologist (physician) and Advanced Practice Providers (APPs -  Physician Assistants and Nurse Practitioners) who all work together to provide you with the care you need, when you need it.  We recommend signing up for the patient portal called "MyChart".  Sign up information is provided on this After Visit Summary.  MyChart is used  to connect with patients for Virtual Visits (Telemedicine).  Patients are able to view lab/test results, encounter notes, upcoming appointments, etc.  Non-urgent messages can be sent to your provider as well.   To learn more about what you can do with MyChart, go to [email or web address].    Your next appointment:   1 year(s)  The format for your next appointment:   In Person  Provider:   [address]  -Berniece Salines, Do   Other Instructions    Adopting a Healthy Lifestyle.  Know what a healthy weight is for you (roughly BMI <25) and aim to maintain this   Aim for 7+ servings of fruits and vegetables daily   65-80+ fluid ounces of water or unsweet tea for healthy kidneys   Limit to max 1 drink of alcohol per day; avoid smoking/tobacco   Limit animal fats in diet for cholesterol and heart health - choose grass fed whenever available   Avoid highly processed foods, and foods high in saturated/trans fats   Aim for low stress - take time to unwind and care for your mental health   Aim for 150 min of moderate intensity exercise weekly for heart health, and weights twice weekly for bone health   Aim for 7-9 hours of sleep daily   When it comes to diets, agreement about the perfect plan isnt easy to find, even among the experts. Experts at the [employer] [place] developed an idea known as the Healthy Eating Plate. Just imagine a plate divided into  logical, healthy portions.   The emphasis is on diet quality:   Load up on vegetables and fruits - one-half of your plate: Aim for color and variety, and remember that potatoes dont count.   Go for whole grains - one-quarter of your plate: Whole wheat, barley, wheat berries, quinoa, oats, brown rice, and foods made with them. If you want pasta, go with whole wheat pasta.   Protein power - one-quarter of your plate: Fish, chicken, beans, and nuts are all healthy, versatile protein sources. Limit red meat.   The diet, however, does go beyond the plate, offering a few other suggestions.   Use healthy plant oils, such as olive, canola, soy, corn, sunflower and peanut. Check the labels, and avoid partially hydrogenated oil, which have unhealthy trans fats.   If youre thirsty, drink water. Coffee and tea are good in moderation, but skip sugary drinks and limit milk and dairy products to one or two daily servings.   The type of carbohydrate in the diet is more important than the amount. Some sources of carbohydrates, such as vegetables, fruits, whole grains, and beans-are healthier than others.   Finally, stay active  Signed, Berniece Salines, DO  03/16/2021 8:45 AM    [place]

## 2021-03-22 LAB — FLECAINIDE LEVEL: Flecainide: 0.18 ug/mL — ABNORMAL LOW (ref 0.20–1.00)

## 2021-03-23 ENCOUNTER — Telehealth: Payer: Self-pay

## 2021-03-23 NOTE — Telephone Encounter (Signed)
-----   Message from Berniece Salines, DO sent at 03/23/2021  1:55 PM EDT ----- Flecainide level suboptimal but has not discontinued.  Symptoms were continued.

## 2021-03-23 NOTE — Telephone Encounter (Signed)
Spoke with patient regarding results and recommendation.  Patient verbalizes understanding and is agreeable to plan of care. Advised patient to call back with any issues or concerns.  

## 2021-03-29 ENCOUNTER — Encounter: Payer: Self-pay | Admitting: Internal Medicine

## 2021-04-15 ENCOUNTER — Other Ambulatory Visit: Payer: Self-pay

## 2021-04-15 MED ORDER — FLECAINIDE ACETATE 50 MG PO TABS: 50.0000 mg | ORAL_TABLET | Freq: Two times a day (BID) | ORAL | 2 refills | Status: DC

## 2021-04-15 NOTE — Telephone Encounter (Signed)
Refill of Flecainide 50 mg sent to [facility].

## 2021-05-11 ENCOUNTER — Ambulatory Visit: Payer: Managed Care, Other (non HMO) | Admitting: Cardiology

## 2021-05-27 ENCOUNTER — Ambulatory Visit: Payer: Managed Care, Other (non HMO)

## 2021-05-27 ENCOUNTER — Telehealth: Payer: Self-pay | Admitting: Cardiology

## 2021-05-27 ENCOUNTER — Other Ambulatory Visit: Payer: Self-pay

## 2021-05-27 VITALS — BP 100/80 | HR 74 | Ht 66.0 in | Wt 168.0 lb

## 2021-05-27 DIAGNOSIS — R002 Palpitations: Secondary | ICD-10-CM

## 2021-05-27 NOTE — Telephone Encounter (Signed)
Patient c/o Palpitations:  High priority if patient c/o lightheadedness, shortness of breath, or chest pain  How long have you had palpitations/irregular HR/ Afib? Are you having the symptoms now? Started this morning, yes  Are you currently experiencing lightheadedness, SOB or CP? Dizziness, weird sensation in chest heaviness, feels like cant swallow  Do you have a history of afib (atrial fibrillation) or irregular heart rhythm? yes  Have you checked your BP or HR? (document readings if available): 110/76 HR 90  Are you experiencing any other symptoms? No   Patient states she started having dizziness this morning and has a "weird sensation" in her chest and has skipping heart beats. She says the sensation in her chest is like a chest heaviness and she feels like she can't swallow.

## 2021-05-27 NOTE — Telephone Encounter (Signed)
Pt states that this is the same sensation she was having prior to starting on Flecainide. Pt states that Dr. Harriet Masson told her she might need her flecainide dose increased. Pt would like to see if she can come in for a nurse visit as she has done before for an EKG. Sent to [facility] How do you advise?

## 2021-05-27 NOTE — Telephone Encounter (Signed)
Pt will come in for a nurse visit and EKG per Dr. Bettina Gavia.

## 2021-05-28 NOTE — Progress Notes (Signed)
Reason for visit: palpations and chest tightness  Name of MD requesting visit: [place]  H&P: palpations on flecainide  ROS related to problem: palpations on flecainide possibly need flecainide adjusted   Assessment and plan per MD: [facility] no changes

## 2021-05-31 ENCOUNTER — Other Ambulatory Visit (INDEPENDENT_AMBULATORY_CARE_PROVIDER_SITE_OTHER): Payer: Managed Care, Other (non HMO) | Admitting: *Deleted

## 2021-05-31 DIAGNOSIS — R002 Palpitations: Secondary | ICD-10-CM

## 2021-06-11 ENCOUNTER — Other Ambulatory Visit: Payer: Self-pay

## 2021-06-11 ENCOUNTER — Telehealth: Payer: Self-pay

## 2021-06-11 DIAGNOSIS — Z79899 Other long term (current) drug therapy: Secondary | ICD-10-CM

## 2021-06-11 NOTE — Telephone Encounter (Signed)
Pt called and wants to know can her flecainide be increased due to her symptoms increasing and being more frequent. Will relay message to Dr. Harriet Masson.

## 2021-06-11 NOTE — Telephone Encounter (Signed)
Pt aware to come have labs drawn next week. Orders placed

## 2021-06-11 NOTE — Progress Notes (Signed)
Ec

## 2021-06-11 NOTE — Telephone Encounter (Signed)
Patient is following up. She is hopeful for a response before the weekend. Please call.

## 2021-06-14 ENCOUNTER — Other Ambulatory Visit: Payer: Self-pay

## 2021-06-14 DIAGNOSIS — Z79899 Other long term (current) drug therapy: Secondary | ICD-10-CM

## 2021-06-28 ENCOUNTER — Telehealth: Payer: Self-pay | Admitting: Cardiology

## 2021-06-28 NOTE — Telephone Encounter (Signed)
Called pt to let her know we do not have the results yet. After speaking with the lab tech she stats the results are still not back and several aren't resulted yet. Called Deborah Petty back to let her know about the update, she thanked me for calling her back.

## 2021-06-28 NOTE — Telephone Encounter (Signed)
Deborah Petty is calling requesting her lab results from 06/14/21.

## 2021-06-29 ENCOUNTER — Other Ambulatory Visit: Payer: Self-pay

## 2021-06-29 LAB — FLECAINIDE LEVEL: Flecainide: 0.14 ug/mL — ABNORMAL LOW (ref 0.20–1.00)

## 2021-06-29 MED ORDER — FLECAINIDE ACETATE 100 MG PO TABS
100.0000 mg | ORAL_TABLET | Freq: Two times a day (BID) | ORAL | 3 refills | Status: DC
Start: 2021-06-29 — End: 2022-03-22

## 2021-06-29 NOTE — Progress Notes (Signed)
Prescription sent to pharmacy.

## 2021-09-22 ENCOUNTER — Encounter: Payer: Self-pay | Admitting: Cardiology

## 2021-09-22 ENCOUNTER — Other Ambulatory Visit: Payer: Self-pay | Admitting: *Deleted

## 2021-09-22 ENCOUNTER — Other Ambulatory Visit: Payer: Self-pay | Admitting: Cardiology

## 2021-09-22 DIAGNOSIS — Z79899 Other long term (current) drug therapy: Secondary | ICD-10-CM

## 2021-10-01 LAB — FLECAINIDE LEVEL: Flecainide: 0.53 ug/ml (ref 0.20–1.00)

## 2021-10-11 IMAGING — US US THYROID
1 series · 12 of 12 positions shown · non-contrast
Comparison: Thyroid ultrasound 11/24/2020

CLINICAL DATA: Prior ultrasound demonstrated a hypoechoic nodule in
left thyroid lobe. Patient presents for ultrasound-guided
fine-needle aspiration of the nodule.

EXAM:
THYROID ULTRASOUND
TECHNIQUE: Ultrasound examination of the thyroid gland and adjacent soft
tissues was performed.

[Series 1: us thyroid · 0.05mm/px · 12 acquisitions, 12 frames shown]
[im 1/12]
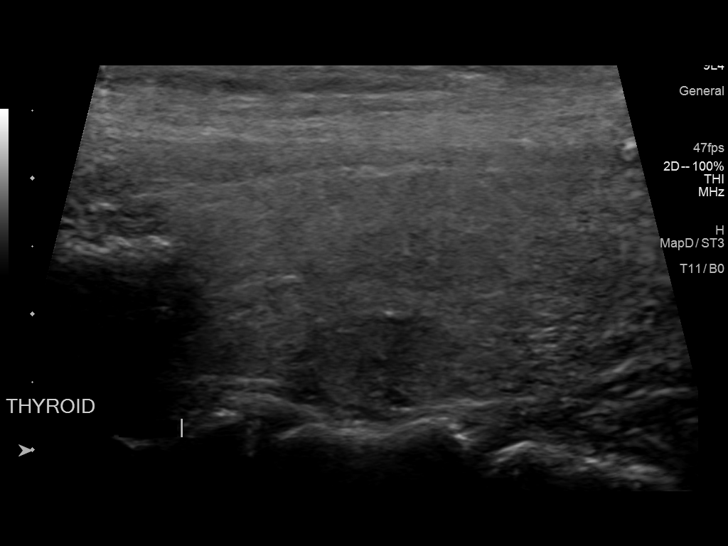
[im 2/12]
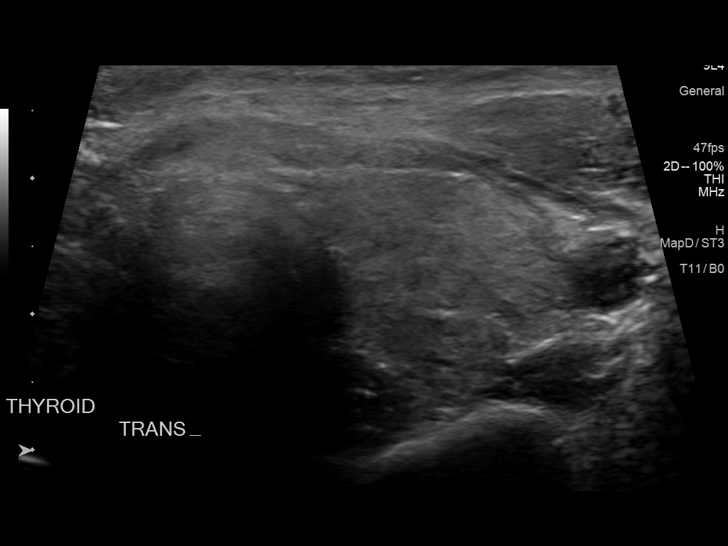
[im 3/12]
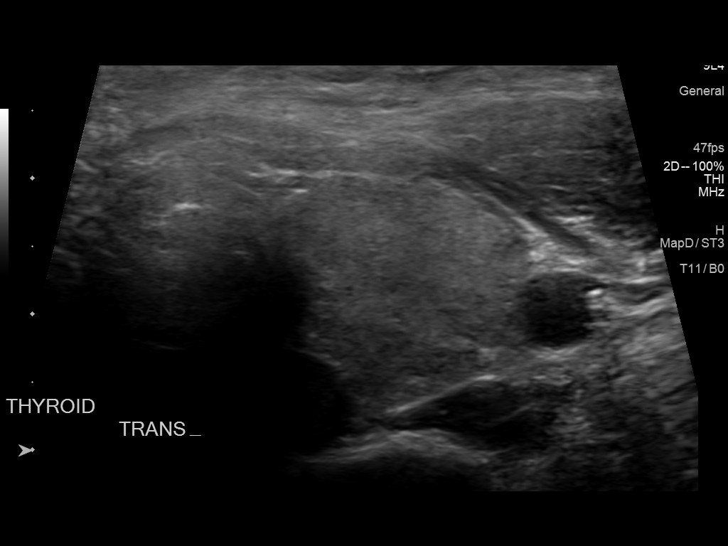
[im 4/12]
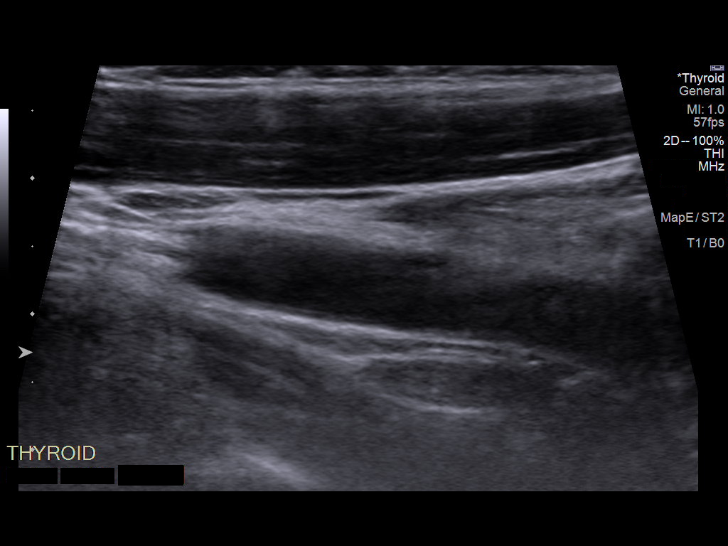
[im 5/12]
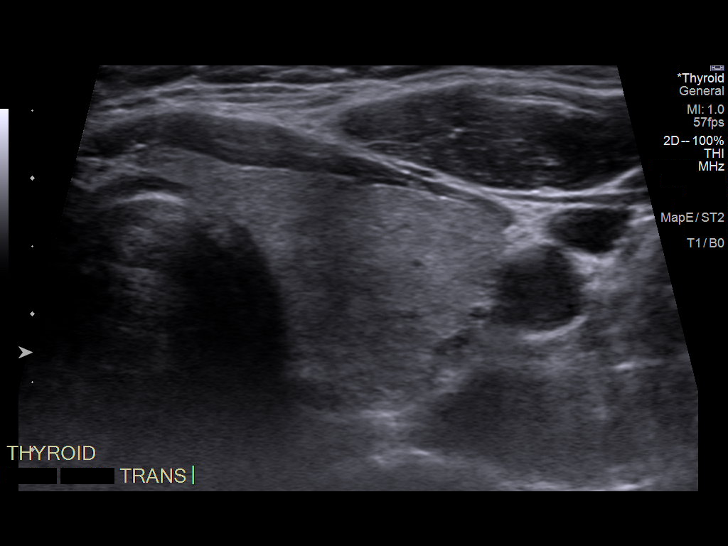
[im 6/12]
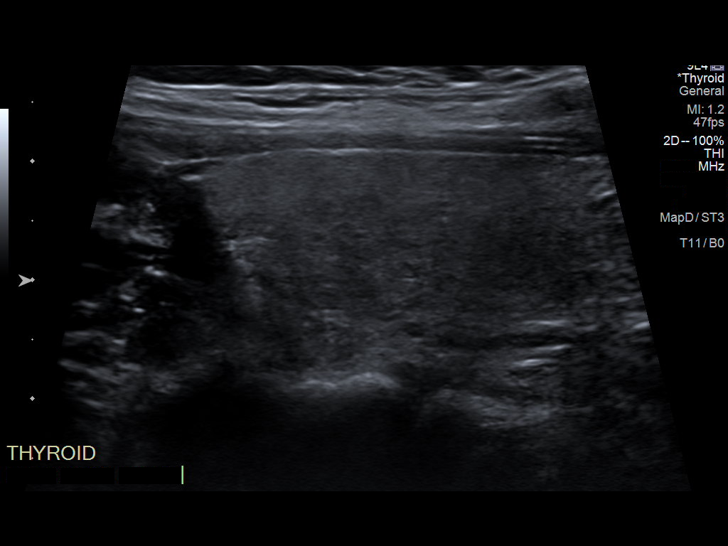
[im 7/12]
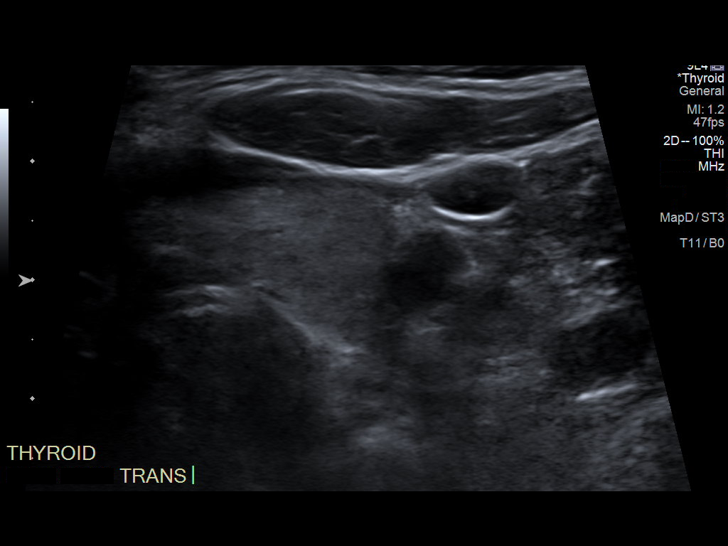
[im 8/12]
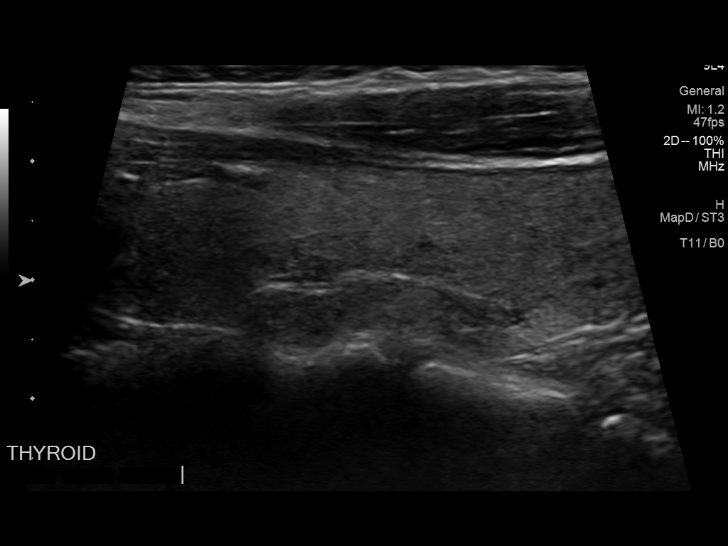
[im 9/12]
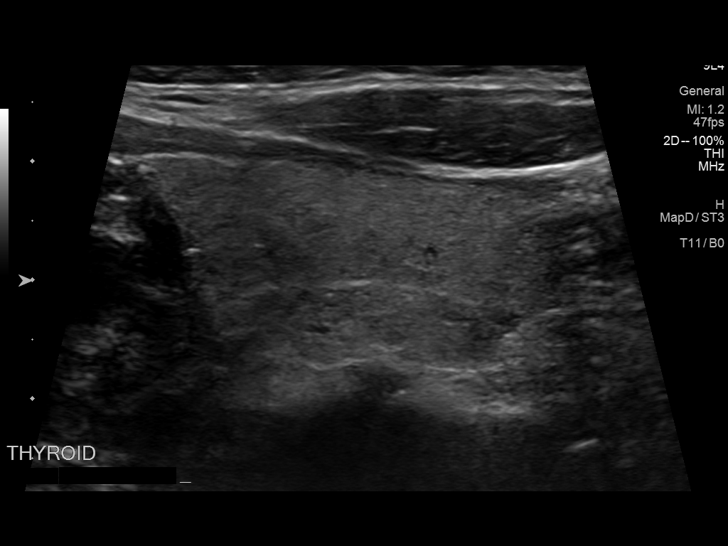
[im 10/12]
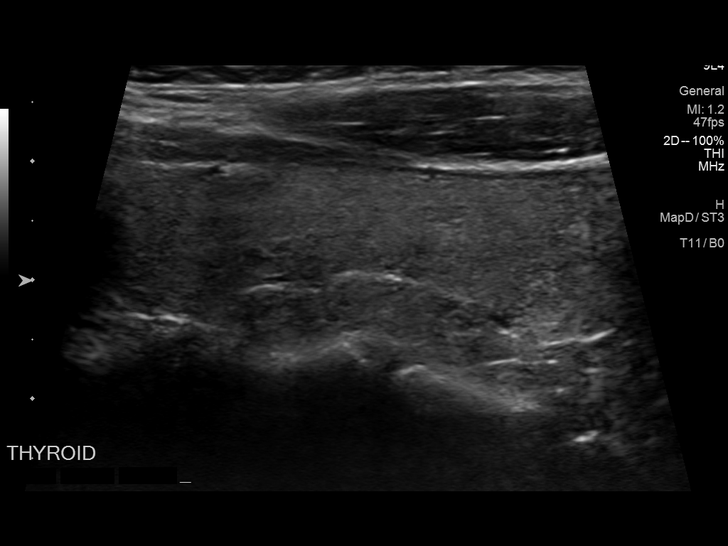
[im 11/12]
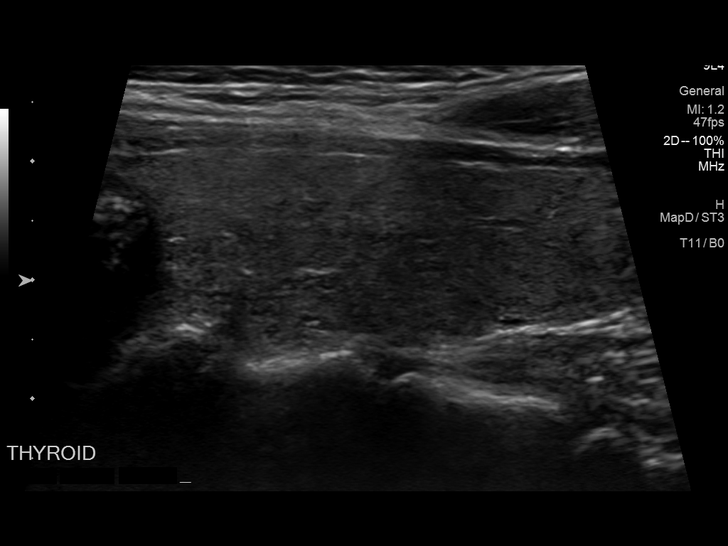
[im 12/12]
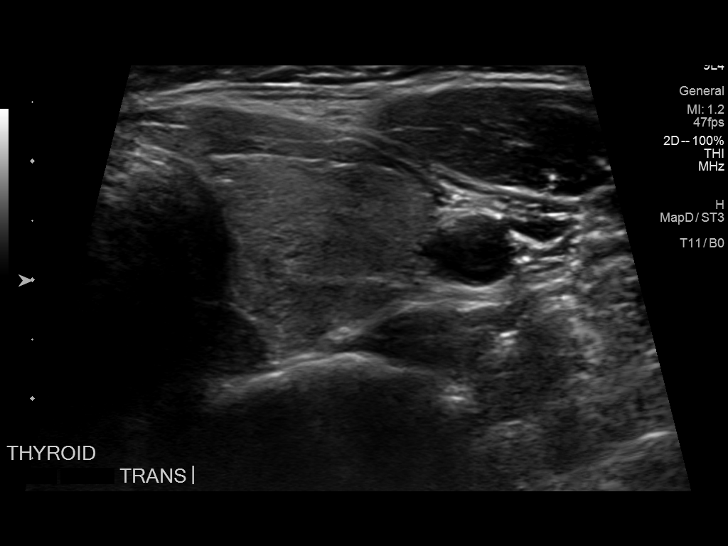

[12 of 12 positions shown; findings below may reference images not displayed]

FINDINGS: The area of concern is along the posterior aspect of the left
thyroid lobe. This area was thoroughly evaluated with more than one
ultrasound transducer. The area of concern is most compatible with a
pseudo nodule related to cleft in this area. This area is slightly
hypoechoic to the surrounding parenchyma on the longitudinal images
but cannot be reproduced on the transverse images. No discrete
nodule identified in the left thyroid lobe.
IMPRESSION: No discrete left thyroid nodule. The area of concern is most
compatible with a pseudo nodule related to a cleft. Biopsy was not
performed.

## 2021-12-07 ENCOUNTER — Other Ambulatory Visit: Payer: Self-pay

## 2021-12-07 MED ORDER — METOPROLOL SUCCINATE ER 25 MG PO TB24: 12.5000 mg | ORAL_TABLET | Freq: Every day | ORAL | 3 refills | Status: DC

## 2022-03-22 ENCOUNTER — Other Ambulatory Visit: Payer: Self-pay

## 2022-03-22 MED ORDER — FLECAINIDE ACETATE 100 MG PO TABS: 100.0000 mg | ORAL_TABLET | Freq: Two times a day (BID) | ORAL | 3 refills | Status: DC

## 2022-03-22 MED ORDER — METOPROLOL SUCCINATE ER 25 MG PO TB24: 12.5000 mg | ORAL_TABLET | Freq: Every day | ORAL | 3 refills | Status: DC

## 2022-03-23 ENCOUNTER — Telehealth: Payer: Self-pay | Admitting: Cardiology

## 2022-03-23 NOTE — Telephone Encounter (Signed)
Returned call to patient who states that she is due for repeat flecainide level. Advised patient I would forward message to Dr. Harriet Masson for her to advise on ordering this. Patient verbalized understanding.

## 2022-03-23 NOTE — Telephone Encounter (Signed)
Patient is requesting orders to have lab work prior to 8/04 appointment with Dr. Harriet Masson.

## 2022-03-23 NOTE — Telephone Encounter (Signed)
*  STAT* If patient is at the pharmacy, call can be transferred to refill team.   1. Which medications need to be refilled? (please list name of each medication and dose if known)  flecainide (TAMBOCOR) 100 MG tablet  2. Which pharmacy/location (including street and city if local pharmacy) is medication to be sent to? [facility]  3. Do they need a 30 day or 90 day supply? 90 day supply  Please transfer current Rx to [facility].

## 2022-03-25 MED ORDER — FLECAINIDE ACETATE 100 MG PO TABS: 100.0000 mg | ORAL_TABLET | Freq: Two times a day (BID) | ORAL | 3 refills | Status: DC

## 2022-04-04 NOTE — Telephone Encounter (Signed)
Please place an order for the flecainide level

## 2022-04-06 ENCOUNTER — Other Ambulatory Visit: Payer: Self-pay

## 2022-04-06 DIAGNOSIS — Z79899 Other long term (current) drug therapy: Secondary | ICD-10-CM

## 2022-04-06 NOTE — Telephone Encounter (Signed)
Lab orders placed. Will place printed lab orders in out going mail.

## 2022-04-06 NOTE — Telephone Encounter (Signed)
Called pt. No answer, left a message to let her know the orders were placed and will be mailed to her.

## 2022-04-26 LAB — FLECAINIDE LEVEL: Flecainide: 0.64 ug/ml (ref 0.20–1.00)

## 2022-04-29 ENCOUNTER — Telehealth: Payer: Self-pay | Admitting: Cardiology

## 2022-04-29 NOTE — Telephone Encounter (Signed)
Patient informed that her flecainide level is normal per Dr. Harriet Masson. Patient had no questions or concerns at this time.

## 2022-04-29 NOTE — Telephone Encounter (Signed)
Patient returned call for lab work results.

## 2022-05-06 ENCOUNTER — Encounter: Payer: Self-pay | Admitting: Cardiology

## 2022-05-06 ENCOUNTER — Ambulatory Visit (INDEPENDENT_AMBULATORY_CARE_PROVIDER_SITE_OTHER): Payer: No Typology Code available for payment source | Admitting: Cardiology

## 2022-05-06 VITALS — BP 86/66 | HR 73 | Ht 65.0 in | Wt 179.0 lb

## 2022-05-06 DIAGNOSIS — Z79899 Other long term (current) drug therapy: Secondary | ICD-10-CM

## 2022-05-06 DIAGNOSIS — I493 Ventricular premature depolarization: Secondary | ICD-10-CM

## 2022-05-06 NOTE — Progress Notes (Signed)
Cardiology Office Note:    Date:  05/06/2022   ID:  Deborah Petty, Deborah Petty 1981-01-12, MRN [id number]  PCP:  Ernestene Kiel, MD  Cardiologist:  Berniece Salines, DO  Electrophysiologist:  None   Referring MD: Ernestene Kiel, MD   " I am doing well"   History of Present Illness:    Deborah Petty is a 42 y.o. female with a hx of  hx of DVT 6 weeks after delivery in August 2021 was treated with Xarelto for 4 months and then stop, recently diagnosed thyroid nodule... The patient presented presented to be evaluated for palpitation and shortness of breath.  During her initial visit we will get an echocardiogram as well I placed a monitor on the patient.  She was also referred to hematology giving history of DVT post pregnancy and female with Xarelto.  I saw the patient on March 03, 2021 at that time we discussed her monitor which showed significant symptomatic PVCs.  Prior to her visit she had been started on low-dose beta-blocker.  During that time she did tell me that she was still experiencing symptoms.  I started her on low-dose flecainide 50 mg twice a day.      Past Medical History:  Diagnosis Date   Abnormal thyroid blood test 03/25/2013   Anxiety    Breech presentation 06/07/2016   Chronic right-sided low back pain 32/20/2542   Complication of anesthesia    review chart from last CS.  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   Insomnia    Lipoma of lower back 10/24/2017   Medical history non-contributory    Migraines 03/25/2013   Paresthesia of right foot 08/08/2018   Postpartum care following cesarean delivery (9/5) 06/07/2016   Previous cesarean delivery affecting pregnancy 05/17/2020   Previous cesarean section 05/17/2020   Ptosis of left eyelid 12/08/2014   Status post repeat low  transverse cesarean section 8/15 05/18/2020    Past Surgical History:  Procedure Laterality Date   BACK SURGERY     lipoma removal   CESAREAN SECTION N/A 06/07/2016   Procedure: PRIMARY CESAREAN SECTION - BREECH PRESENTATION;  Surgeon: Brien Few, MD;  Location: [place];  Service: Obstetrics;  Laterality: N/A;  EDD 06/07/2016   CESAREAN SECTION N/A 05/17/2020   Procedure: Repeat CESAREAN SECTION;  Surgeon: Brien Few, MD;  Location: [place] [facility];  Service: Obstetrics;  Laterality: N/A;  EDD: 05/23/20   TONSILLECTOMY      Current Medications: Current Meds  Medication Sig   buPROPion (WELLBUTRIN XL) 300 MG 24 hr tablet Take 300 mg by mouth daily.   busPIRone (BUSPAR) 15 MG tablet Take 15 mg by mouth 2 (two) times daily.   flecainide (TAMBOCOR) 100 MG tablet Take 1 tablet (100 mg total) by mouth 2 (two) times daily.   metoprolol succinate (TOPROL XL) 25 MG 24 hr tablet Take 0.5 tablets (12.5 mg total) by mouth daily.   oxybutynin (DITROPAN-XL) 10 MG 24 hr tablet Take 10 mg by mouth 2 (two) times daily.   topiramate (TOPAMAX) 50 MG tablet Take 50 mg by mouth daily.   venlafaxine XR (EFFEXOR-XR) 75 MG 24 hr capsule Take 225 mg by mouth daily.     Allergies:   Patient has no known  allergies.   Social History   Socioeconomic History   Marital status: Married    Spouse name: Not on file   Number of children: Not on file   Years of education: Not on file   Highest education level: Not on file  Occupational History   Not on file  Tobacco Use   Smoking status: Never   Smokeless tobacco: Never  Substance and Sexual Activity   Alcohol use: Not Currently    Comment: socially   Drug use: No   Sexual activity: Yes    Birth control/protection: None  Other Topics Concern   Not on file  Social History Narrative   ** Merged History Encounter **       Social Determinants of Health   Financial Resource Strain: Not on file  Food Insecurity: Not on file  Transportation  Needs: Not on file  Physical Activity: Not on file  Stress: Not on file  Social Connections: Not on file     Family History: The patient's family history includes COPD in her father; Diabetes in her father, maternal grandmother, and paternal grandfather; Heart attack in her maternal grandmother; Heart disease in her paternal grandfather; Hyperlipidemia in her father and mother; Hypertension in her father and mother; Osteoarthritis in her mother; Thyroid disease in her father.  ROS:   Review of Systems  Constitution: Negative for decreased appetite, fever and weight gain.  HENT: Negative for congestion, ear discharge, hoarse voice and sore throat.   Eyes: Negative for discharge, redness, vision loss in right eye and visual halos.  Cardiovascular: Negative for chest pain, dyspnea on exertion, leg swelling, orthopnea and palpitations.  Respiratory: Negative for cough, hemoptysis, shortness of breath and snoring.   Endocrine: Negative for heat intolerance and polyphagia.  Hematologic/Lymphatic: Negative for bleeding problem. Does not bruise/bleed easily.  Skin: Negative for flushing, nail changes, rash and suspicious lesions.  Musculoskeletal: Negative for arthritis, joint pain, muscle cramps, myalgias, neck pain and stiffness.  Gastrointestinal: Negative for abdominal pain, bowel incontinence, diarrhea and excessive appetite.  Genitourinary: Negative for decreased libido, genital sores and incomplete emptying.  Neurological: Negative for brief paralysis, focal weakness, headaches and loss of balance.  Psychiatric/Behavioral: Negative for altered mental status, depression and suicidal ideas.  Allergic/Immunologic: Negative for HIV exposure and persistent infections.    EKGs/Labs/Other Studies Reviewed:    The following studies were reviewed today:   EKG:  The ekg ordered today demonstrates sinus rhythm 60 bpm  compared to prior ekg   ZIO monitor Patch Wear Time:  14 days and 0 hours  starting [name] 16, 2022 Indication: Palpitations   Patient had a min HR of 50 bpm, max HR of 155 bpm, and avg HR of 80 bpm.   Premature atrial complexes were rare. Premature ventricular complexes were frequent (5.9%, 91703).  Ventricular Bigeminy and Trigeminy were present.   7 patient triggered events and 7 diary events are associated with premature ventricular complex by   No ventricular tachycardia, no pauses, no atrial fibrillation.   The study is remarkable for frequent symptomatic premature ventricular complexes.     Transthoracic echocardiogram Feb 04, 2021 IMPRESSIONS   1. Left ventricular ejection fraction, by estimation, is 60 to 65%. The  left ventricle has normal function. The left ventricle has no regional  wall motion abnormalities. Left ventricular diastolic parameters were  normal.   2. Right ventricular systolic function is normal. The right ventricular  size is normal. There is normal pulmonary artery systolic pressure.   3.  The mitral valve is normal in structure. No evidence of mitral valve  regurgitation. No evidence of mitral stenosis.   4. The aortic valve is normal in structure. Aortic valve regurgitation is  not visualized. No aortic stenosis is present.   5. The inferior vena cava is normal in size with greater than 50%  respiratory variability, suggesting right atrial pressure of 3 mmHg  Recent Labs: No results found for requested labs within last 365 days.  Recent Lipid Panel No results found for: "CHOL", "TRIG", "HDL", "CHOLHDL", "VLDL", "LDLCALC", "LDLDIRECT"  Physical Exam:    VS:  BP (!) 86/66   Pulse 73   Ht '5\' 5"'$  (1.651 m)   Wt 179 lb (81.2 kg)   LMP 05/01/2022   SpO2 100%   BMI 29.79 kg/m     Wt Readings from Last 3 Encounters:  05/06/22 179 lb (81.2 kg)  05/27/21 168 lb (76.2 kg)  03/16/21 166 lb 6.4 oz (75.5 kg)     GEN: Well nourished, well developed in no acute distress HEENT: Normal NECK: No JVD; No carotid  bruits LYMPHATICS: No lymphadenopathy CARDIAC: S1S2 noted,RRR, no murmurs, rubs, gallops RESPIRATORY:  Clear to auscultation without rales, wheezing or rhonchi  ABDOMEN: Soft, non-tender, non-distended, +bowel sounds, no guarding. EXTREMITIES: No edema, No cyanosis, no clubbing MUSCULOSKELETAL:  No deformity  SKIN: Warm and dry NEUROLOGIC:  Alert and oriented x 3, non-focal PSYCHIATRIC:  Normal affect, good insight  ASSESSMENT:    1. Medication management   2. High risk medication use   3. Frequent symptomatic PVCs    PLAN:    She is doing well from a CV standpoint. No need for any change in her medication regimen. Her flecainide level is within normal, ekg no change from previous.  The patient is in agreement with the above plan. The patient left the office in stable condition.  The patient will follow up in 1 year.   Medication Adjustments/Labs and Tests Ordered: Current medicines are reviewed at length with the patient today.  Concerns regarding medicines are outlined above.  Orders Placed This Encounter  Procedures   EKG 12-Lead   No orders of the defined types were placed in this encounter.   Patient Instructions  Medication Instructions:  Your physician recommends that you continue on your current medications as directed. Please refer to the Current Medication list given to you today.  *If you need a refill on your cardiac medications before your next appointment, please call your pharmacy*   Lab Work: None If you have labs (blood work) drawn today and your tests are completely normal, you will receive your results only by: [place] (if you have MyChart) OR A paper copy in the mail If you have any lab test that is abnormal or we need to change your treatment, we will call you to review the results.   Testing/Procedures: None   Follow-Up: At [facility], you and your health needs are our priority.  As part of our continuing mission to provide you  with exceptional heart care, we have created designated Provider Care Teams.  These Care Teams include your primary Cardiologist (physician) and Advanced Practice Providers (APPs -  Physician Assistants and Nurse Practitioners) who all work together to provide you with the care you need, when you need it.  We recommend signing up for the patient portal called "MyChart".  Sign up information is provided on this After Visit Summary.  MyChart is used to connect with patients for Virtual Visits (  Telemedicine).  Patients are able to view lab/test results, encounter notes, upcoming appointments, etc.  Non-urgent messages can be sent to your provider as well.   To learn more about what you can do with MyChart, go to [email or web address].    Your next appointment:   1 year(s)  The format for your next appointment:   In Person  Provider:   Berniece Salines, DO     Other Instructions   Important Information About Sugar         Adopting a Healthy Lifestyle.  Know what a healthy weight is for you (roughly BMI <25) and aim to maintain this   Aim for 7+ servings of fruits and vegetables daily   65-80+ fluid ounces of water or unsweet tea for healthy kidneys   Limit to max 1 drink of alcohol per day; avoid smoking/tobacco   Limit animal fats in diet for cholesterol and heart health - choose grass fed whenever available   Avoid highly processed foods, and foods high in saturated/trans fats   Aim for low stress - take time to unwind and care for your mental health   Aim for 150 min of moderate intensity exercise weekly for heart health, and weights twice weekly for bone health   Aim for 7-9 hours of sleep daily   When it comes to diets, agreement about the perfect plan isnt easy to find, even among the experts. Experts at the [place] developed an idea known as the Healthy Eating Plate. Just imagine a plate divided into logical, healthy portions.   The emphasis is  on diet quality:   Load up on vegetables and fruits - one-half of your plate: Aim for color and variety, and remember that potatoes dont count.   Go for whole grains - one-quarter of your plate: Whole wheat, barley, wheat berries, quinoa, oats, brown rice, and foods made with them. If you want pasta, go with whole wheat pasta.   Protein power - one-quarter of your plate: Fish, chicken, beans, and nuts are all healthy, versatile protein sources. Limit red meat.   The diet, however, does go beyond the plate, offering a few other suggestions.   Use healthy plant oils, such as olive, canola, soy, corn, sunflower and peanut. Check the labels, and avoid partially hydrogenated oil, which have unhealthy trans fats.   If youre thirsty, drink water. Coffee and tea are good in moderation, but skip sugary drinks and limit milk and dairy products to one or two daily servings.   The type of carbohydrate in the diet is more important than the amount. Some sources of carbohydrates, such as vegetables, fruits, whole grains, and beans-are healthier than others.   Finally, stay active  Signed, Berniece Salines, DO  05/06/2022 2:23 PM    [place]

## 2022-05-06 NOTE — Patient Instructions (Signed)
Medication Instructions:  Your physician recommends that you continue on your current medications as directed. Please refer to the Current Medication list given to you today.  *If you need a refill on your cardiac medications before your next appointment, please call your pharmacy*   Lab Work: None If you have labs (blood work) drawn today and your tests are completely normal, you will receive your results only by: MyChart Message (if you have MyChart) OR A paper copy in the mail If you have any lab test that is abnormal or we need to change your treatment, we will call you to review the results.   Testing/Procedures: None   Follow-Up: At [facility], you and your health needs are our priority.  As part of our continuing mission to provide you with exceptional heart care, we have created designated Provider Care Teams.  These Care Teams include your primary Cardiologist (physician) and Advanced Practice Providers (APPs -  Physician Assistants and Nurse Practitioners) who all work together to provide you with the care you need, when you need it.  We recommend signing up for the patient portal called "MyChart".  Sign up information is provided on this After Visit Summary.  MyChart is used to connect with patients for Virtual Visits (Telemedicine).  Patients are able to view lab/test results, encounter notes, upcoming appointments, etc.  Non-urgent messages can be sent to your provider as well.   To learn more about what you can do with MyChart, go to [email or web address].    Your next appointment:   1 year(s)  The format for your next appointment:   In Person  Provider:   Kardie Tobb, DO     Other Instructions   Important Information About Sugar       

## 2022-06-10 ENCOUNTER — Telehealth: Payer: Self-pay | Admitting: Internal Medicine

## 2022-06-11 NOTE — Telephone Encounter (Signed)
Patient accidentally took an extra dose of felcainide Feeling completely fine Told her to skip morning dose. And resume usual dose of flecanide from tomorrow evening

## 2022-08-15 ENCOUNTER — Telehealth: Payer: Self-pay

## 2022-08-15 ENCOUNTER — Encounter (INDEPENDENT_AMBULATORY_CARE_PROVIDER_SITE_OTHER): Payer: No Typology Code available for payment source | Admitting: Psychiatry

## 2022-08-15 ENCOUNTER — Ambulatory Visit (INDEPENDENT_AMBULATORY_CARE_PROVIDER_SITE_OTHER): Payer: No Typology Code available for payment source | Admitting: Psychiatry

## 2022-08-15 VITALS — BP 102/63 | HR 66 | Ht 66.0 in | Wt 182.0 lb

## 2022-08-15 DIAGNOSIS — G43719 Chronic migraine without aura, intractable, without status migrainosus: Secondary | ICD-10-CM

## 2022-08-15 MED ORDER — RIZATRIPTAN BENZOATE 10 MG PO TABS: 10.0000 mg | ORAL_TABLET | ORAL | 6 refills | Status: DC | PRN

## 2022-08-15 NOTE — Progress Notes (Signed)
Referring:  Ernestene Kiel, MD [address] [place],  [place] [address]  PCP: Ernestene Kiel, MD  Neurology was asked to evaluate Deborah Petty, a 41 year old female for a chief complaint of headaches.  Our recommendations of care will be communicated by shared medical record.    CC:  headaches  History provided from self  HPI:  Medical co-morbidities: anxiety, SVT  The patient presents for evaluation of headaches.. She has a history of migraines since she was a teenager. In the past 2 months she has also noticed tenderness over her scalp. She has migraines twice a week and has lower level headaches daily. She has previously tried Imitrex for her migraines but this made her feel sick so she stopped it. Currently takes Fioricet or Advil as needed which are not very effective.  She follows with orthopedics for chronic neck pain and has recently started physical therapy.  She notes that her left eyelid "lags" while blinking, which was first noticed by a nurse around 2016. She has undergone neurological workup including single fiber EMG (2016) and MRI brain (2021) which were unremarkable. 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 She denies history of Bell's palsy.  Headache History: Onset: 2 months ago Triggers: strong smells, menstrual cycle Aura: none Location: right temple Quality/Description: Associated Symptoms:  Photophobia: yes  Phonophobia: yes  Nausea: yes Allodynia: yes Worse with activity?: yes Duration of headaches: hours  Headache days per month: 30 Migraine days per month: 8 Headache free days per month: 0  Current Treatment: Abortive Fioricet Advil  Preventative none  Prior Therapies                                 Topamax 50 mg daily - worsened headaches Effexor 225 mg daily Metoprolol 12.5 mg daily Amitriptyline Gabapentin - lack of efficacy Imitrex - side effects, lack of efficacy Fioricet - lack of  efficacy Zofran - lack of efficacy Phenergan   LABS: 03/15/22: TSH, CBC, CMP wnl  IMAGING:  MRI brain 2021 normal per patient, results unavailable for review  CTH and C-spine 2003 unremarkable   Current Outpatient Medications on File Prior to Visit  Medication Sig Dispense Refill   ACETAMINOPHEN-BUTALBITAL 50-325 MG TABS take 1 tablet as needed for headaches four times a day Orally 90 days     buPROPion (WELLBUTRIN XL) 300 MG 24 hr tablet Take 300 mg by mouth daily.     busPIRone (BUSPAR) 30 MG tablet 30 mg twice a day by oral route.     flecainide (TAMBOCOR) 100 MG tablet Take 1 tablet (100 mg total) by mouth 2 (two) times daily. 180 tablet 3   metoprolol succinate (TOPROL XL) 25 MG 24 hr tablet Take 0.5 tablets (12.5 mg total) by mouth daily. 90 tablet 3   oxybutynin (DITROPAN-XL) 10 MG 24 hr tablet Take 10 mg by mouth 2 (two) times daily.     promethazine (PHENERGAN) 25 MG tablet take 1 tablet as needed Orally every 6 hrs for migraine/nausea 75     venlafaxine XR (EFFEXOR-XR) 75 MG 24 hr capsule Take 225 mg by mouth daily.     topiramate (TOPAMAX) 50 MG tablet Take 50 mg by mouth daily. (Patient not taking: Reported on 08/15/2022)     No current facility-administered medications on file prior to visit.     Allergies: No Known Allergies  Family History: Migraine or other headaches in  the family:  dad has chronic headaches Aneurysms in a first degree relative:  none Brain tumors in the family:  none Other neurological illness in the family:   none  Past Medical History: Past Medical History:  Diagnosis Date   Abnormal thyroid blood test 03/25/2013   Anxiety    Breech presentation 06/07/2016   Chronic right-sided low back pain 62/69/4854   Complication of anesthesia    review chart from last CS.  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   Insomnia    Lipoma of lower back 10/24/2017   Medical history non-contributory    Migraines 03/25/2013   Paresthesia of right foot 08/08/2018   Postpartum care following cesarean delivery (9/5) 06/07/2016   Previous cesarean delivery affecting pregnancy 05/17/2020   Previous cesarean section 05/17/2020   Ptosis of left eyelid 12/08/2014   Status post repeat low transverse cesarean section 8/15 05/18/2020    Past Surgical History Past Surgical History:  Procedure Laterality Date   BACK SURGERY     lipoma removal   CESAREAN SECTION N/A 06/07/2016   Procedure: PRIMARY CESAREAN SECTION - BREECH PRESENTATION;  Surgeon: Brien Few, MD;  Location: [place];  Service: Obstetrics;  Laterality: N/A;  EDD 06/07/2016   CESAREAN SECTION N/A 05/17/2020   Procedure: Repeat CESAREAN SECTION;  Surgeon: Brien Few, MD;  Location: [place] [facility];  Service: Obstetrics;  Laterality: N/A;  EDD: 05/23/20   TONSILLECTOMY      Social History: Social History   Tobacco Use   Smoking status: Never   Smokeless tobacco: Never  Substance Use Topics   Alcohol use: Not Currently    Comment: socially   Drug use: No    ROS: Negative for fevers, chills. Positive for headaches. All other systems reviewed and negative unless stated otherwise in HPI.   Physical Exam:   Vital Signs: BP 102/63   Pulse 66   Ht '5\' 6"'$  (1.676 m)   Wt 182 lb (82.6 kg)   BMI 29.38 kg/m  GENERAL: well appearing,in no acute distress,alert SKIN:  Color, texture, turgor normal. No rashes or lesions HEAD:  Normocephalic/atraumatic. CV:  RRR RESP: Normal respiratory effort MSK: +tenderness to palpation over bilateral occiput, neck, and shoulders  NEUROLOGICAL: Mental Status: Alert, oriented to person, place and time,Follows commands Cranial Nerves: PERRL, visual fields intact to confrontation, extraocular movements intact, facial sensation intact, no facial  droop or ptosis, hearing grossly intact, no dysarthria Motor: muscle strength 5/5 both upper and lower extremities,no drift, normal tone Reflexes: 2+ throughout Sensation: intact to light touch all 4 extremities Coordination: Finger-to- nose-finger intact bilaterally Gait: normal-based   IMPRESSION: 41 year old female with a history of anxiety, SVT (well-controlled on metoprolol and flecainide) who presents for evaluation of migraines and scalp tenderness. Suspect her scalp pain is secondary to allodynia from chronic migraines. Will start Botox for migraine prevention, which may also help with her chronic neck pain. Will start Maxalt for migraine rescue.  PLAN: -Prevention: Start Botox -Rescue: Start Maxalt 10 mg PRN -Next steps: consider CGRP or Qulipta for migraine prevention   I spent a total of 45 minutes chart reviewing and counseling the patient. Headache education was done. Discussed treatment options including preventive and acute medications, and physical  therapy. Discussed medication side effects, adverse reactions and drug interactions. Written educational materials and patient instructions outlining all of the above were given.  Follow-up: for Botox   Genia Harold, MD 08/15/2022   3:43 PM

## 2022-08-15 NOTE — Telephone Encounter (Signed)
She wants to know if you think magnesium and L threonione will help for migraine prevention.

## 2022-08-15 NOTE — Telephone Encounter (Signed)
MD rq to start pt on Botox 200U [id number] Dx G43.719. please submit PA. Thank you.

## 2022-08-16 ENCOUNTER — Other Ambulatory Visit (HOSPITAL_COMMUNITY): Payer: Self-pay

## 2022-08-16 NOTE — Telephone Encounter (Signed)
Patient Advocate Encounter   Received notification that prior authorization for Botox 200UNIT solution is required.   PA submitted on 08/16/2022 Key [id number] Status is pending       Lyndel Safe, [place] Patient Advocate Specialist [place] Patient Advocate Team Direct Number: [phone]  Fax: [phone]

## 2022-08-16 NOTE — Telephone Encounter (Signed)
BotoxOne Benefit Verification [id number] Submitted

## 2022-08-16 NOTE — Telephone Encounter (Signed)

## 2022-08-18 MED ORDER — ONABOTULINUMTOXINA 200 UNITS IJ SOLR: 200.0000 [IU] | INTRAMUSCULAR | 1 refills | Status: DC

## 2022-08-18 NOTE — Telephone Encounter (Signed)
No Prior Authorization Required.  Must fill through [facility]

## 2022-08-18 NOTE — Telephone Encounter (Signed)
I contacted [employer] to attempt to schedule delivery since PA wasn't required. Spoke to [place] and she informed me a PA was actually required with this patients [facility] plan. Can we submit the PA please?

## 2022-08-18 NOTE — Addendum Note (Signed)
Addended by: Gertie Baron D on: 08/18/2022 09:00 AM   Modules accepted: Orders

## 2022-08-18 NOTE — Telephone Encounter (Signed)
John from [facility] called [place] stating they tried to verify pts coverage and insurance stated botox is not in patient plan. Pt can be BB per Pt coverage chart but will have $210 copay. Will advise pt of this and to sign up for the botox saving program.

## 2022-08-22 ENCOUNTER — Encounter: Payer: Self-pay | Admitting: Cardiology

## 2022-08-22 ENCOUNTER — Encounter (INDEPENDENT_AMBULATORY_CARE_PROVIDER_SITE_OTHER): Payer: No Typology Code available for payment source | Admitting: Psychiatry

## 2022-08-22 DIAGNOSIS — G43719 Chronic migraine without aura, intractable, without status migrainosus: Secondary | ICD-10-CM | POA: Diagnosis not present

## 2022-08-23 MED ORDER — NURTEC 75 MG PO TBDP: 75.0000 mg | ORAL_TABLET | ORAL | 6 refills | Status: DC | PRN

## 2022-08-23 NOTE — Telephone Encounter (Signed)

## 2022-08-29 NOTE — Telephone Encounter (Signed)
Please contact pt to get her scheduled for Botox injections. BB

## 2022-08-29 NOTE — Telephone Encounter (Signed)
Pt scheduled for botox with Ward Givens on 09/20/22 at 8:00am

## 2022-08-30 ENCOUNTER — Ambulatory Visit: Payer: No Typology Code available for payment source | Admitting: Psychiatry

## 2022-08-30 ENCOUNTER — Ambulatory Visit (INDEPENDENT_AMBULATORY_CARE_PROVIDER_SITE_OTHER): Payer: No Typology Code available for payment source | Admitting: Adult Health

## 2022-08-30 ENCOUNTER — Encounter: Payer: Self-pay | Admitting: Adult Health

## 2022-08-30 DIAGNOSIS — G43719 Chronic migraine without aura, intractable, without status migrainosus: Secondary | ICD-10-CM | POA: Diagnosis not present

## 2022-08-30 MED ORDER — ONABOTULINUMTOXINA 200 UNITS IJ SOLR
155.0000 [IU] | Freq: Once | INTRAMUSCULAR | Status: AC
  Administered 2022-08-30: 155 [IU] via INTRAMUSCULAR

## 2022-08-30 NOTE — Progress Notes (Signed)
Botox- 200 units x 1 vial Lot: [id number] Expiration: 01/2025 NDC: 4847-2072-18  Bacteriostatic 0.9% Sodium Chloride- 74m total Lot: [id number]: 06/04/2023 NDC: [address]-79 Dx: GV87.215 B/B

## 2022-08-30 NOTE — Progress Notes (Signed)
     08/30/22: First botox injection today. Having daily headache and atleast 1-2 migraine a week   BOTOX PROCEDURE NOTE FOR MIGRAINE HEADACHE    Contraindications and precautions discussed with patient(above). Aseptic procedure was observed and patient tolerated procedure. Procedure performed by Ward Givens, NP  The condition has existed for more than 6 months, and pt does not have a diagnosis of ALS, Myasthenia Gravis or Lambert-Eaton Syndrome.  Risks and benefits of injections discussed and pt agrees to proceed with the procedure.  Written consent obtained  These injections do not cause sedations or hallucinations which the oral therapies may cause.  Indication/Diagnosis: chronic migraine BOTOX(J0585) injection was performed according to protocol by [employer]. 200 units of BOTOX was dissolved into 4 cc NS.   NDC: [address]-36  Type of toxin: Botox  Botox- 200 units x 1 vial Lot: [id number] Expiration: 01/2025 NDC: 3005-1102-11   Bacteriostatic 0.9% Sodium Chloride- 75m total Lot: [id number]: 06/04/2023 NDC: [address]-31  Dx: GY38.887    Description of procedure:  The patient was placed in a sitting position. The standard protocol was used for Botox as follows, with 5 units of Botox injected at each site:   -Procerus muscle, midline injection  -Corrugator muscle, bilateral injection  -Frontalis muscle, bilateral injection, with 2 sites each side, medial injection was performed in the upper one third of the frontalis muscle, in the region vertical from the medial inferior edge of the superior orbital rim. The lateral injection was again in the upper one third of the forehead vertically above the lateral limbus of the cornea, 1.5 cm lateral to the medial injection site.  -Temporalis muscle injection, 4 sites, bilaterally. The first injection was 3 cm above the tragus of the ear, second injection site was 1.5 cm to 3 cm up from the first injection site in line  with the tragus of the ear. The third injection site was 1.5-3 cm forward between the first 2 injection sites. The fourth injection site was 1.5 cm posterior to the second injection site.  -Occipitalis muscle injection, 3 sites, bilaterally. The first injection was done one half way between the occipital protuberance and the tip of the mastoid process behind the ear. The second injection site was done lateral and superior to the first, 1 fingerbreadth from the first injection. The third injection site was 1 fingerbreadth superiorly and medially from the first injection site.  -Cervical paraspinal muscle injection, 2 sites, bilateral knee first injection site was 1 cm from the midline of the cervical spine, 3 cm inferior to the lower border of the occipital protuberance. The second injection site was 1.5 cm superiorly and laterally to the first injection site.  -Trapezius muscle injection was performed at 3 sites, bilaterally. The first injection site was in the upper trapezius muscle halfway between the inflection point of the neck, and the acromion. The second injection site was one half way between the acromion and the first injection site. The third injection was done between the first injection site and the inflection point of the neck.   Will return for repeat injection in 3 months.   A 200 unitsof Botox was used, 155 units were injected, the rest of the Botox was wasted. The patient tolerated the procedure well, there were no complications of the above procedure.  MWard Givens MSN, NP-C 08/30/2022, 11:38 AM [facility] [address] [place]  [phone]

## 2022-08-31 ENCOUNTER — Telehealth: Payer: Self-pay

## 2022-08-31 NOTE — Telephone Encounter (Signed)
PA submitted via CMM Key: [id number] Your information has been sent to [facility], awaiting determination

## 2022-09-01 ENCOUNTER — Ambulatory Visit: Payer: No Typology Code available for payment source | Admitting: Adult Health

## 2022-09-06 NOTE — Telephone Encounter (Signed)
approved through 09/01/2023

## 2022-09-20 ENCOUNTER — Ambulatory Visit: Payer: No Typology Code available for payment source | Admitting: Adult Health

## 2022-11-28 ENCOUNTER — Encounter: Payer: Self-pay | Admitting: *Deleted

## 2022-11-29 ENCOUNTER — Encounter: Payer: Self-pay | Admitting: Adult Health

## 2022-11-29 ENCOUNTER — Ambulatory Visit (INDEPENDENT_AMBULATORY_CARE_PROVIDER_SITE_OTHER): Payer: No Typology Code available for payment source | Admitting: Adult Health

## 2022-11-29 DIAGNOSIS — G43719 Chronic migraine without aura, intractable, without status migrainosus: Secondary | ICD-10-CM | POA: Diagnosis not present

## 2022-11-29 MED ORDER — ONABOTULINUMTOXINA 200 UNITS IJ SOLR
155.0000 [IU] | Freq: Once | INTRAMUSCULAR | Status: AC
  Administered 2022-11-29: 145 [IU] via INTRAMUSCULAR

## 2022-11-29 NOTE — Telephone Encounter (Signed)
Noted  

## 2022-11-29 NOTE — Progress Notes (Signed)
Botox- 200 units x 1 vial Lot: [id number] Expiration: 03/2025 NDC: [id number]  Bacteriostatic 0.9% Sodium Chloride- 53m total Lot: [id number]: 08/2024 NDC: [id number] Dx: [id number]

## 2022-11-29 NOTE — Progress Notes (Signed)
11/29/22: Reports Botox has worked well. Only two headaches this last month. Usually can take just Advil and it will resolve. Unable to tolerate Maxalt. Has nurtec but has not had to use this month.   08/30/22: First botox injection today. Having daily headache and atleast 1-2 migraine a week   BOTOX PROCEDURE NOTE FOR MIGRAINE HEADACHE    Contraindications and precautions discussed with patient(above). Aseptic procedure was observed and patient tolerated procedure. Procedure performed by Ward Givens, NP  The condition has existed for more than 6 months, and pt does not have a diagnosis of ALS, Myasthenia Gravis or Lambert-Eaton Syndrome.  Risks and benefits of injections discussed and pt agrees to proceed with the procedure.  Written consent obtained  These injections do not cause sedations or hallucinations which the oral therapies may cause.  Indication/Diagnosis: chronic migraine BOTOX(J0585) injection was performed according to protocol by [employer]. 200 units of BOTOX was dissolved into 4 cc NS.   NDC: [id number]  Type of toxin: Botox  Botox- 200 units x 1 vial Lot: [id number] Expiration: 03/2025 NDC: [id number]   Bacteriostatic 0.9% Sodium Chloride- 52m total Lot: [id number]: 08/2024 NDC: [id number]  Dx: [id number]    Description of procedure:  The patient was placed in a sitting position. The standard protocol was used for Botox as follows, with 5 units of Botox injected at each site:   -Procerus muscle, midline injection  -Corrugator muscle, deferred d/t having a scar above the right eye- states last botox injection stayed really sore and she feels its due to scar tissue there.   -Frontalis muscle, bilateral injection, with 2 sites each side, medial injection was performed in the upper one third of the frontalis muscle, in the region vertical from the medial inferior edge of the superior orbital rim. The lateral injection was again in the upper  one third of the forehead vertically above the lateral limbus of the cornea, 1.5 cm lateral to the medial injection site.  -Temporalis muscle injection, 4 sites, bilaterally. The first injection was 3 cm above the tragus of the ear, second injection site was 1.5 cm to 3 cm up from the first injection site in line with the tragus of the ear. The third injection site was 1.5-3 cm forward between the first 2 injection sites. The fourth injection site was 1.5 cm posterior to the second injection site.  -Occipitalis muscle injection, 3 sites, bilaterally. The first injection was done one half way between the occipital protuberance and the tip of the mastoid process behind the ear. The second injection site was done lateral and superior to the first, 1 fingerbreadth from the first injection. The third injection site was 1 fingerbreadth superiorly and medially from the first injection site.  -Cervical paraspinal muscle injection, 2 sites, bilateral knee first injection site was 1 cm from the midline of the cervical spine, 3 cm inferior to the lower border of the occipital protuberance. The second injection site was 1.5 cm superiorly and laterally to the first injection site.  -Trapezius muscle injection was performed at 3 sites, bilaterally. The first injection site was in the upper trapezius muscle halfway between the inflection point of the neck, and the acromion. The second injection site was one half way between the acromion and the first injection site. The third injection was done between the first injection site and the inflection point of the neck.   Will return for repeat injection in 3 months.   A 200 unitsof Botox  was used, 145 units were injected, the rest of the Botox was wasted. The patient tolerated the procedure well, there were no complications of the above procedure.  Ward Givens, MSN, NP-C 11/29/2022, 8:32 AM [facility] [address] [place], [place]  [address] [phone]

## 2022-12-28 ENCOUNTER — Other Ambulatory Visit: Payer: Self-pay | Admitting: *Deleted

## 2022-12-28 MED ORDER — METOPROLOL SUCCINATE ER 25 MG PO TB24: 12.5000 mg | ORAL_TABLET | Freq: Every day | ORAL | 2 refills | Status: DC

## 2023-01-31 ENCOUNTER — Other Ambulatory Visit: Payer: Self-pay

## 2023-02-12 ENCOUNTER — Other Ambulatory Visit: Payer: Self-pay | Admitting: Cardiology

## 2023-02-28 ENCOUNTER — Ambulatory Visit (INDEPENDENT_AMBULATORY_CARE_PROVIDER_SITE_OTHER): Payer: No Typology Code available for payment source | Admitting: Adult Health

## 2023-02-28 DIAGNOSIS — G43719 Chronic migraine without aura, intractable, without status migrainosus: Secondary | ICD-10-CM | POA: Diagnosis not present

## 2023-02-28 MED ORDER — ONABOTULINUMTOXINA 200 UNITS IJ SOLR
155.0000 [IU] | Freq: Once | INTRAMUSCULAR | Status: AC
  Administered 2023-02-28: 155 [IU] via INTRAMUSCULAR

## 2023-02-28 NOTE — Progress Notes (Signed)
02/28/23: Reports that she has had more headaches. Uses Nurtec and OTC medication medications. Has had 4-5 migraines since the last botox injection . This time she would like the corrugators injected.   11/29/22: Reports Botox has worked well. Only two headaches this last month. Usually can take just Advil and it will resolve. Unable to tolerate Maxalt. Has nurtec but has not had to use this month.   08/30/22: First botox injection today. Having daily headache and atleast 1-2 migraine a week   BOTOX PROCEDURE NOTE FOR MIGRAINE HEADACHE    Contraindications and precautions discussed with patient(above). Aseptic procedure was observed and patient tolerated procedure. Procedure performed by Butch Penny, NP  The condition has existed for more than 6 months, and pt does not have a diagnosis of ALS, Myasthenia Gravis or Lambert-Eaton Syndrome.  Risks and benefits of injections discussed and pt agrees to proceed with the procedure.  Written consent obtained  These injections do not cause sedations or hallucinations which the oral therapies may cause.  Indication/Diagnosis: chronic migraine BOTOX(J0585) injection was performed according to protocol by [employer]. 200 units of BOTOX was dissolved into 4 cc NS.   NDC: [address]-40  Type of toxin: Botox  Botox- 200 units x 1 vial Lot: [id number] Expiration: 03/2025 NDC: 7829-5621-30   Bacteriostatic 0.9% Sodium Chloride- 4 mL  Lot: [id number] Expiration: 01/02/2024 NDC: 6962-9528-41   Dx: L24.401   Description of procedure:  The patient was placed in a sitting position. The standard protocol was used for Botox as follows, with 5 units of Botox injected at each site:   -Procerus muscle, midline injection  -Corrugator muscle, deferred d/t having a scar above the right eye- states last botox injection stayed really sore and she feels its due to scar tissue there.   -Frontalis muscle, bilateral injection, with 2 sites each side, medial  injection was performed in the upper one third of the frontalis muscle, in the region vertical from the medial inferior edge of the superior orbital rim. The lateral injection was again in the upper one third of the forehead vertically above the lateral limbus of the cornea, 1.5 cm lateral to the medial injection site.  -Temporalis muscle injection, 4 sites, bilaterally. The first injection was 3 cm above the tragus of the ear, second injection site was 1.5 cm to 3 cm up from the first injection site in line with the tragus of the ear. The third injection site was 1.5-3 cm forward between the first 2 injection sites. The fourth injection site was 1.5 cm posterior to the second injection site.  -Occipitalis muscle injection, 3 sites, bilaterally. The first injection was done one half way between the occipital protuberance and the tip of the mastoid process behind the ear. The second injection site was done lateral and superior to the first, 1 fingerbreadth from the first injection. The third injection site was 1 fingerbreadth superiorly and medially from the first injection site.  -Cervical paraspinal muscle injection, 2 sites, bilateral knee first injection site was 1 cm from the midline of the cervical spine, 3 cm inferior to the lower border of the occipital protuberance. The second injection site was 1.5 cm superiorly and laterally to the first injection site.  -Trapezius muscle injection was performed at 3 sites, bilaterally. The first injection site was in the upper trapezius muscle halfway between the inflection point of the neck, and the acromion. The second injection site was one half way between the acromion and the first injection site. The  third injection was done between the first injection site and the inflection point of the neck.   Will return for repeat injection in 3 months.   A 200 unitsof Botox was used, 155 units were injected, the rest of the Botox was wasted. The patient tolerated  the procedure well, there were no complications of the above procedure.  Butch Penny, MSN, NP-C 02/28/2023, 8:10 AM [facility] [address] [place], [place] [address] [phone]

## 2023-02-28 NOTE — Progress Notes (Signed)
Botox- 200 units x 1 vial Lot:[id number] Expiration: 03/2025 NDC: 0023-3921-02  Bacteriostatic 0.9% Sodium Chloride- 4 mL  Lot: [id number] Expiration: 01/02/2024 NDC: 0409-1966-02  Dx: G43.719 B/B Witnessed by Sandy,RN 

## 2023-03-02 ENCOUNTER — Other Ambulatory Visit: Payer: Self-pay

## 2023-03-02 MED ORDER — METOPROLOL SUCCINATE ER 25 MG PO TB24: 12.5000 mg | ORAL_TABLET | Freq: Every day | ORAL | 0 refills | Status: DC

## 2023-04-17 ENCOUNTER — Encounter: Payer: Self-pay | Admitting: Cardiology

## 2023-04-18 NOTE — Telephone Encounter (Signed)
Called patient and LVM that we had a cancellation this morning with Dr Servando Salina if she is available. If not we would like to schedule an appt. With the next available APP. Please call office

## 2023-04-18 NOTE — Telephone Encounter (Signed)
Patient appt next Tuesday. Any labs?  Flecainide level last done in July 2023. Pleae advise   Patient returned call.  The appointment was no longer available. She states the discomfort was not relieved last night with antacids. No nausea nor sweating.  She states she may have taken more deep breaths but not SOB.   She currently has no pain or discomfort. She states each episode last approx. Couple minutes, maybe 3-4, or maybe longer.  No definite answer.  She states she took antacids because it didn't go away.  The pain was a "burning sensation but also a piercing pain when it was going through".  No heaviness. It started "kind of" the left side then upper shoulder and back. She states she did not go to ED even though directed per message.  She states "I did not have it again" but message said her episode last night did not resolve with antacid.  Conflicting information from patient.  Appointment made for next Tuesday.  Advised again to go to ED if the pain is not resolved with antacids, or if travels to shoulder, arm or jaw.Or if any sweating nausea or other symptoms. She states understanding.

## 2023-04-20 ENCOUNTER — Other Ambulatory Visit: Payer: Self-pay

## 2023-04-20 ENCOUNTER — Encounter: Payer: Self-pay | Admitting: Cardiology

## 2023-04-20 MED ORDER — NURTEC 75 MG PO TBDP: 75.0000 mg | ORAL_TABLET | ORAL | 6 refills | Status: DC | PRN

## 2023-04-23 NOTE — Progress Notes (Unsigned)
Cardiology Clinic Note   Date: 04/25/2023 ID: Deborah Petty [birth year], MRN [id number]  Primary Cardiologist:  Thomasene Ripple, DO  Patient Profile    Deborah Petty is a 42 y.o. female who presents to the clinic today for routine follow up.     Past medical history significant for: Palpitations. 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  Rare PACs.  Patient triggered events associated with PVCs. Echo 02/04/2021: EF 60 to 65%.  Normal diastolic parameters.  Normal RV function.  No significant valvular abnormalities. DVT. Occurring in August 2021 6 weeks postpartum.  On Xarelto x 4 months but stopped due to significant bleeding requiring blood transfusion. Migraines.     History of Present Illness    Deborah Petty was first evaluated by Dr. Servando Salina on 01/11/2021 for complaints of palpitations and shortness of breath at the request of Dr. Sudie Bailey.  Palpitations described as fluttering with occasional shortness of breath.  She also reported some nausea and dizziness.  She had recently been found to have a thyroid nodule.  Echo showed normal LV/RV function and no valvular abnormalities.  14-day ZIO showed frequent PVCs and she was started on low-dose metoprolol.  Upon follow-up patient continued to complain of palpitations and flecainide was added to her regimen with resolution of her symptoms.  Patient was last seen in the office by Dr. Servando Salina on 05/06/2022 for routine follow-up.  She was doing well at that time and no changes were made.  Patient contacted the office by MyChart on 04/17/2023 with complaints of left-sided chest pain.  She reported taking antacids with no relief of symptoms.  [facility] mobile tracing taken at that time showed a PVC.  Per triage notes from [name], LPN: "She states the discomfort was not relieved last night with antacids. No nausea nor sweating.  She states she may have taken more deep breaths but not SOB.   She currently  has no pain or discomfort. She states each episode last approx. Couple minutes, maybe 3-4, or maybe longer.  No definite answer.  She states she took antacids because it didn't go away.  The pain was a "burning sensation but also a piercing pain when it was going through".  No heaviness. It started "kind of" the left side then upper shoulder and back. She states she did not go to ED even though directed per message.  She states "I did not have it again" but message said her episode last night did not resolve with antacid.  Conflicting information from patient. Appointment made for next Tuesday.  Advised again to go to ED if the pain is not resolved with antacids, or if travels to shoulder, arm or jaw.Or if any sweating nausea or other symptoms. She states understanding."  Today, patient is here with her two young sons. She reports continued episodes of upper left chest pain radiating to left shoulder and through to her back. 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 On the day it began (7/14) the episodes came and went for almost 12 hours. She has had 4-5 episodes since then with the last one occurring last night. She denies associated shortness of breath but does feel she breaths heavier during the episodes. She denies palpitations or nausea during episodes. She did have one episode of increased palpitations a few days ago, which is unusual for her since starting flecainide but none since. She  has a strong family history of heart disease with her maternal grandmother dying of MI at age 9, paternal grandfather passing of MI/stroke at age 59, and both parents with arrhythmias (dad has afib). She denies lower extremity edema or DOE. She does not do formal exercise but stays busy with her two children, ages 27 and 2.      ROS: All other systems reviewed and are otherwise negative except as noted in History of Present Illness.  Studies  Reviewed    EKG Interpretation Date/Time:  Tuesday Petty 23 2024 09:03:36 EDT Ventricular Rate:  78 PR Interval:  122 QRS Duration:  110 QT Interval:  408 QTC Calculation: 465 R Axis:   -20  Text Interpretation: Normal sinus rhythm Incomplete right bundle branch block T wave abnormality, consider anterior ischemia Prolonged QT Unchanged from previous EKG 05/06/2022 (not in muse) Confirmed by Carlos Levering [phone]) on 04/25/2023 9:14:19 AM        Physical Exam    VS:  BP 94/68   Pulse 78   Ht 5\' 6"  (1.676 m)   Wt 178 lb (80.7 kg)   SpO2 99%   BMI 28.73 kg/m  , BMI Body mass index is 28.73 kg/m.  GEN: Well nourished, well developed, in no acute distress. Neck: No JVD or carotid bruits. Cardiac:  RRR. No murmurs. No rubs or gallops.   Respiratory:  Respirations regular and unlabored. Clear to auscultation without rales, wheezing or rhonchi. GI: Soft, nontender, nondistended. Extremities: Radials/DP/PT 2+ and equal bilaterally. No clubbing or cyanosis. No edema.  Skin: Warm and dry, no rash. Neuro: Strength intact.  Assessment & Plan    Chest pain.  Patient reports a 10 day history of episodic chest discomfort starting at the left upper chest and radiating to left shoulder and through to her back that lasts 4-5 minutes and resolves on its own. She has a strong family history of heart disease. EKG shows NSR with incomplete RBBB and T wave abnormality unchanged from previous. Will get Coronary CTA for further evaluation. BMP today.  Palpitations/PVCs.  14-day ZIO May 2022 showed frequent PVCs (5.9%).  Patient reports one episode of increased palpitations 4 days ago. This is unusual for her since starting flecainide. EKG today shows NSR with no ectopy. Continue metoprolol and flecainide.  Will get a flecainide level today.  Disposition: Coronary CTA. BMP and flecainide level today. Return in 3 months or sooner as needed.          Signed, Etta Grandchild. Yuki Purves, DNP, NP-C

## 2023-04-25 ENCOUNTER — Ambulatory Visit: Payer: No Typology Code available for payment source | Attending: Student | Admitting: Student

## 2023-04-25 ENCOUNTER — Encounter: Payer: Self-pay | Admitting: Student

## 2023-04-25 ENCOUNTER — Other Ambulatory Visit (HOSPITAL_COMMUNITY): Payer: Self-pay

## 2023-04-25 ENCOUNTER — Telehealth: Payer: Self-pay | Admitting: Cardiology

## 2023-04-25 VITALS — BP 94/68 | HR 78 | Ht 66.0 in | Wt 178.0 lb

## 2023-04-25 DIAGNOSIS — R072 Precordial pain: Secondary | ICD-10-CM | POA: Diagnosis not present

## 2023-04-25 DIAGNOSIS — R002 Palpitations: Secondary | ICD-10-CM | POA: Diagnosis not present

## 2023-04-25 DIAGNOSIS — I493 Ventricular premature depolarization: Secondary | ICD-10-CM

## 2023-04-25 MED ORDER — IVABRADINE HCL 5 MG PO TABS: 10.0000 mg | ORAL_TABLET | Freq: Once | ORAL | 0 refills | Status: DC

## 2023-04-25 MED ORDER — IVABRADINE HCL 5 MG PO TABS
10.0000 mg | ORAL_TABLET | Freq: Once | ORAL | 0 refills | Status: AC
Start: 2023-04-25 — End: 2023-04-28
  Filled 2023-04-25: qty 2, 1d supply, fill #0

## 2023-04-25 NOTE — Telephone Encounter (Signed)
There is no generic for this medication. It is a one time dose of 10 mg (2 tablets) for coronary CTA.

## 2023-04-25 NOTE — Telephone Encounter (Signed)
Pt c/o medication issue:  1. Name of Medication:   ivabradine (CORLANOR) 5 MG TABS tablet    2. How are you currently taking this medication (dosage and times per day)?   3. Are you having a reaction (difficulty breathing--STAT)?   4. What is your medication issue?  Pharmacy is calling stating insurance will not cover this medication and is requesting a generic or different version of this medication called in.

## 2023-04-25 NOTE — Patient Instructions (Signed)
Medication Instructions:  Ivabradine 5 mg ( Take 2 Tablets 10 mg 90 minutes-2 Hours prior to Scan). *If you need a refill on your cardiac medications before your next appointment, please call your pharmacy*   Lab Work: BMET, Flecainide Level : today If you have labs (blood work) drawn today and your tests are completely normal, you will receive your results only by: MyChart Message (if you have MyChart) OR A paper copy in the mail If you have any lab test that is abnormal or we need to change your treatment, we will call you to review the results.   Testing/Procedures:   Your cardiac CT will be scheduled at one of the below locations:   [facility] [address] [place], [place] [address] [phone]  [phone]  If scheduled at [facility], please arrive at the [facility] and Children's Entrance (Entrance C2) of [facility] 30 minutes prior to test start time. You can use the FREE valet parking offered at entrance C (encouraged to control the heart rate for the test)  Proceed to the [facility] Radiology Department (first floor) to check-in and test prep.  All radiology patients and guests should use entrance C2 at [facility], accessed from [facility], even though the hospital's physical address listed is [address]    If scheduled at [facility] or [facility], please arrive 15 mins early for check-in and test prep.  There is spacious parking and easy access to the radiology department from the [facility] Heart and Vascular entrance. Please enter here and check-in with the desk attendant.   Please follow these instructions carefully (unless otherwise directed):  An IV will be required for this test and Nitroglycerin will be given.    On the Night Before the Test: Be sure to Drink plenty of water. Do not consume any caffeinated/decaffeinated beverages or chocolate  12 hours prior to your test. Do not take any antihistamines 12 hours prior to your test.  On the Day of the Test: Drink plenty of water until 1 hour prior to the test. Do not eat any food 1 hour prior to test. You may take your regular medications prior to the test.  Take metoprolol (Lopressor) two hours prior to test. If you take Furosemide/Hydrochlorothiazide/Spironolactone, please HOLD on the morning of the test. FEMALES- please wear underwire-free bra if available, avoid dresses & tight clothing       After the Test: Drink plenty of water. After receiving IV contrast, you may experience a mild flushed feeling. This is normal. On occasion, you may experience a mild rash up to 24 hours after the test. This is not dangerous. If this occurs, you can take Benadryl 25 mg and increase your fluid intake. If you experience trouble breathing, this can be serious. If it is severe call 911 IMMEDIATELY. If it is mild, please call our office. If you take any of these medications: Glipizide/Metformin, Avandament, Glucavance, please do not take 48 hours after completing test unless otherwise instructed.  We will call to schedule your test 2-4 weeks out understanding that some insurance companies will need an authorization prior to the service being performed.   For more information and frequently asked questions, please visit our website : [email or web address]  For non-scheduling related questions, please contact the cardiac imaging nurse navigator should you have any questions/concerns: Cardiac Imaging Nurse Navigators Direct Office Dial: [phone]   For scheduling needs, including cancellations and  rescheduling, please call [place], [phone].    Follow-Up: At [facility], you and your health needs are our priority.  As part of our continuing mission to provide you with exceptional heart care, we have created designated Provider Care Teams.  These Care Teams include  your primary Cardiologist (physician) and Advanced Practice Providers (APPs -  Physician Assistants and Nurse Practitioners) who all work together to provide you with the care you need, when you need it.  We recommend signing up for the patient portal called "MyChart".  Sign up information is provided on this After Visit Summary.  MyChart is used to connect with patients for Virtual Visits (Telemedicine).  Patients are able to view lab/test results, encounter notes, upcoming appointments, etc.  Non-urgent messages can be sent to your provider as well.   To learn more about what you can do with MyChart, go to [email or web address].    Your next appointment:   3 month(s)  Provider:   Thomasene Ripple, DO

## 2023-04-25 NOTE — Telephone Encounter (Signed)
I reordered ivabradine (corlanor) to [facility].

## 2023-04-26 LAB — BASIC METABOLIC PANEL
BUN/Creatinine Ratio: 13 (ref 9–23)
BUN: 11 mg/dL (ref 6–20)
CO2: 26 mmol/L (ref 20–29)
Calcium: 9.5 mg/dL (ref 8.7–10.2)
Chloride: 102 mmol/L (ref 96–106)
Creatinine, Ser: 0.84 mg/dL (ref 0.57–1.00)
Glucose: 52 mg/dL — ABNORMAL LOW (ref 70–99)
Potassium: 3.9 mmol/L (ref 3.5–5.2)
Sodium: 141 mmol/L (ref 134–144)
eGFR: 91 mL/min/{1.73_m2} (ref >59)

## 2023-04-26 LAB — FLECAINIDE LEVEL

## 2023-04-27 ENCOUNTER — Other Ambulatory Visit (HOSPITAL_COMMUNITY): Payer: Self-pay

## 2023-05-01 ENCOUNTER — Telehealth: Payer: Self-pay

## 2023-05-01 ENCOUNTER — Telehealth (HOSPITAL_COMMUNITY): Payer: Self-pay | Admitting: *Deleted

## 2023-05-01 NOTE — Telephone Encounter (Signed)
Reaching out to patient to offer assistance regarding upcoming cardiac imaging study; pt verbalizes understanding of appt date/time, parking situation and where to check in, pre-test NPO status and medications ordered, and verified current allergies; name and call back number provided for further questions should they arise  Larey Brick RN Navigator Cardiac Imaging [facility] [phone] office [phone] cell  Patient to take 10mg  ivabradine two hours prior to her cardiac CT scan. She is aware to arrive at 3pm.

## 2023-05-02 ENCOUNTER — Ambulatory Visit (HOSPITAL_COMMUNITY)
Admission: RE | Admit: 2023-05-02 | Discharge: 2023-05-02 | Disposition: A | Discharge status: Home / Self Care | Payer: No Typology Code available for payment source | Source: Ambulatory Visit | Attending: Student | Admitting: Student

## 2023-05-02 DIAGNOSIS — R072 Precordial pain: Secondary | ICD-10-CM

## 2023-05-02 DIAGNOSIS — R002 Palpitations: Secondary | ICD-10-CM | POA: Insufficient documentation

## 2023-05-02 MED ORDER — NITROGLYCERIN 0.4 MG SL SUBL
0.8000 mg | SUBLINGUAL_TABLET | Freq: Once | SUBLINGUAL | Status: AC
  Administered 2023-05-02: 0.8 mg via SUBLINGUAL

## 2023-05-02 MED ORDER — NITROGLYCERIN 0.4 MG SL SUBL
SUBLINGUAL_TABLET | SUBLINGUAL | Status: AC
  Filled 2023-05-02: qty 2

## 2023-05-02 MED ORDER — IOHEXOL 350 MG/ML SOLN
95.0000 mL | Freq: Once | INTRAVENOUS | Status: AC | PRN
  Administered 2023-05-02: 95 mL via INTRAVENOUS

## 2023-05-24 ENCOUNTER — Ambulatory Visit: Payer: No Typology Code available for payment source | Admitting: Adult Health

## 2023-05-25 ENCOUNTER — Ambulatory Visit (INDEPENDENT_AMBULATORY_CARE_PROVIDER_SITE_OTHER): Payer: No Typology Code available for payment source | Admitting: Adult Health

## 2023-05-25 VITALS — BP 105/69 | HR 69

## 2023-05-25 DIAGNOSIS — G43719 Chronic migraine without aura, intractable, without status migrainosus: Secondary | ICD-10-CM | POA: Diagnosis not present

## 2023-05-25 MED ORDER — ONABOTULINUMTOXINA 200 UNITS IJ SOLR
155.0000 [IU] | Freq: Once | INTRAMUSCULAR | Status: AC
Start: 2023-05-25 — End: 2023-05-25
  Administered 2023-05-25: 155 [IU] via INTRAMUSCULAR

## 2023-05-25 NOTE — Progress Notes (Signed)
Botox consent signed  Botox- 200 units x 1 vial Lot: [id number] Expiration: 09/2025 NDC: 1062-6948-54  Bacteriostatic 0.9% Sodium Chloride- 4 mL  Lot: [id number] Expiration: 01/02/2024 NDC: 0093-8182-99  Dx: B71.696 B/B Witnessed by Dub Mikes RN

## 2023-05-25 NOTE — Progress Notes (Signed)
05/25/23: Only one migraine since last seen. She has frequent dull headaches. 2-3 times a week. Will use OTC medicine or nurtec. Works well.   02/28/23: Reports that she has had more headaches. Uses Nurtec and OTC medication medications. Has had 4-5 migraines since the last botox injection . This time she would like the corrugators injected.   11/29/22: Reports Botox has worked well. Only two headaches this last month. Usually can take just Advil and it will resolve. Unable to tolerate Maxalt. Has nurtec but has not had to use this month.   08/30/22: First botox injection today. Having daily headache and atleast 1-2 migraine a week   BOTOX PROCEDURE NOTE FOR MIGRAINE HEADACHE    Contraindications and precautions discussed with patient(above). Aseptic procedure was observed and patient tolerated procedure. Procedure performed by Butch Penny, NP  The condition has existed for more than 6 months, and pt does not have a diagnosis of ALS, Myasthenia Gravis or Lambert-Eaton Syndrome.  Risks and benefits of injections discussed and pt agrees to proceed with the procedure.  Written consent obtained  These injections do not cause sedations or hallucinations which the oral therapies may cause.  Indication/Diagnosis: chronic migraine BOTOX(J0585) injection was performed according to protocol by [employer]. 200 units of BOTOX was dissolved into 4 cc NS.   NDC: [address]-46  Type of toxin: Botox   Botox- 200 units x 1 vial Lot: [id number] Expiration: 09/2025 NDC: 9629-5284-13   Bacteriostatic 0.9% Sodium Chloride- 4 mL  Lot: [id number] Expiration: 01/02/2024 NDC: 2725-3664-40   Dx: H47.425   Description of procedure:  The patient was placed in a sitting position. The standard protocol was used for Botox as follows, with 5 units of Botox injected at each site:   -Procerus muscle, midline injection  -Corrugator muscle bilateral injection   -Frontalis muscle, bilateral injection, with  2 sites each side, medial injection was performed in the upper one third of the frontalis muscle, in the region vertical from the medial inferior edge of the superior orbital rim. The lateral injection was again in the upper one third of the forehead vertically above the lateral limbus of the cornea, 1.5 cm lateral to the medial injection site.  -Temporalis muscle injection, 4 sites, bilaterally. The first injection was 3 cm above the tragus of the ear, second injection site was 1.5 cm to 3 cm up from the first injection site in line with the tragus of the ear. The third injection site was 1.5-3 cm forward between the first 2 injection sites. The fourth injection site was 1.5 cm posterior to the second injection site.  -Occipitalis muscle injection, 3 sites, bilaterally. The first injection was done one half way between the occipital protuberance and the tip of the mastoid process behind the ear. The second injection site was done lateral and superior to the first, 1 fingerbreadth from the first injection. The third injection site was 1 fingerbreadth superiorly and medially from the first injection site.  -Cervical paraspinal muscle injection, 2 sites, bilateral knee first injection site was 1 cm from the midline of the cervical spine, 3 cm inferior to the lower border of the occipital protuberance. The second injection site was 1.5 cm superiorly and laterally to the first injection site.  -Trapezius muscle injection was performed at 3 sites, bilaterally. The first injection site was in the upper trapezius muscle halfway between the inflection point of the neck, and the acromion. The second injection site was one half way between the acromion  and the first injection site. The third injection was done between the first injection site and the inflection point of the neck.   Will return for repeat injection in 3 months.   A 200 units of Botox was used, 155 units were injected, the rest of the Botox was  wasted. The patient tolerated the procedure well there were no complications of the above procedure. However, at the end she felt light headed. She laid on the bed, with legs up, she was given apple juice. Vitals were taken and normal. She felt back to her baseline.   Butch Penny, MSN, NP-C 05/25/2023, 8:23 AM [facility] [address] [place], [place] [address] [phone]

## 2023-06-20 ENCOUNTER — Other Ambulatory Visit: Payer: Self-pay | Admitting: Obstetrics and Gynecology

## 2023-07-04 ENCOUNTER — Other Ambulatory Visit: Payer: Self-pay

## 2023-07-04 ENCOUNTER — Encounter (HOSPITAL_BASED_OUTPATIENT_CLINIC_OR_DEPARTMENT_OTHER): Payer: Self-pay | Admitting: Obstetrics and Gynecology

## 2023-07-04 DIAGNOSIS — S92911A Unspecified fracture of right toe(s), initial encounter for closed fracture: Secondary | ICD-10-CM

## 2023-07-04 HISTORY — DX: Unspecified fracture of right toe(s), initial encounter for closed fracture: S92.911A

## 2023-07-04 NOTE — Progress Notes (Signed)
Spoke w/ via phone for pre-op interview---Pt Lab needs dos---- CBC, T&S, UPT per surgeon orders        Lab results------ COVID test -----patient states asymptomatic no test needed Arrive at ------- 1000 NPO after MN NO Solid Food.  Clear liquids from MN until--- 0900 Med rec completed Medications to take morning of surgery ----- Buspar, Tambocor, Topamax, Effexor, Metoprolol Diabetic medication ----- N/A Patient instructed no nail polish to be worn day of surgery Patient instructed to bring photo id and insurance card day of surgery Patient aware to have Driver (ride ) / caregiver    for 24 hours after surgery - Sharlett Iles Patient Special Instructions ----- Call physician to get date for stopping Magnesium. Pre-Op special Instructions ----- Patient verbalized understanding of instructions that were given at this phone interview. Patient denies chest pain, sob, fever, cough at the interview.

## 2023-07-16 NOTE — H&P (Signed)
Deborah Petty is an 42 y.o. female. 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    Past Medical History:  Diagnosis Date   Abnormal thyroid blood test 03/25/2013   Anxiety    Breech presentation 06/07/2016   Chronic right-sided low back pain 09/18/2018   Complication of anesthesia    review chart from last CS.  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   Insomnia    Lipoma of lower back 10/24/2017   Medical history non-contributory    Migraines 03/25/2013   Paresthesia of right foot 08/08/2018   Postpartum care following cesarean delivery (9/5) 06/07/2016   Previous cesarean delivery affecting pregnancy 05/17/2020   Previous cesarean section 05/17/2020   Ptosis of left eyelid 12/08/2014   Status post repeat low transverse cesarean section 8/15 05/18/2020    Past Surgical History:  Procedure Laterality Date   BACK SURGERY     lipoma removal   CESAREAN SECTION N/A 06/07/2016   Procedure: PRIMARY CESAREAN SECTION - BREECH PRESENTATION;  Surgeon: Olivia Mackie, MD;  Location: [facility];  Service: Obstetrics;  Laterality: N/A;  EDD 06/07/2016   CESAREAN SECTION N/A 05/17/2020   Procedure: Repeat CESAREAN SECTION;  Surgeon: Olivia Mackie, MD;  Location: [facility] LD ORS;  Service: Obstetrics;  Laterality: N/A;  EDD: 05/23/20    TONSILLECTOMY     TUBAL LIGATION  05/17/2020    Family History  Problem Relation Age of Onset   Hyperlipidemia Father    Hypertension Father    Diabetes Father    Thyroid disease Father    COPD Father    Heart attack Maternal Grandmother    Diabetes Maternal Grandmother    Heart disease Paternal Grandfather    Diabetes Paternal Grandfather    Osteoarthritis Mother    Hypertension Mother    Hyperlipidemia Mother     Social History:  reports that she has never smoked. She has never used smokeless tobacco. She reports that she does not currently use alcohol. She reports that she does not use drugs.  Allergies:  Allergies  Allergen Reactions   Rizatriptan Palpitations    Chest tightness, SOB    No medications prior to admission.    Review of Systems  Constitutional: Negative.   All other systems reviewed and are negative.   Height 5\' 6"  (1.676 m), weight 79.4 kg, last menstrual period 06/30/2023, unknown if currently breastfeeding. Physical Exam Constitutional:      Appearance: Normal appearance. She is normal weight.  HENT:     Head: Normocephalic and atraumatic.  Cardiovascular:     Rate and Rhythm: Normal rate and regular rhythm.     Pulses: Normal pulses.     Heart sounds: Normal heart sounds.  Pulmonary:     Effort: Pulmonary effort is normal.     Breath sounds: Normal breath sounds.  Abdominal:     General: Bowel sounds are  normal.     Palpations: Abdomen is soft.  Genitourinary:    General: Normal vulva.     Rectum: Normal.  Musculoskeletal:        General: Normal range of motion.     Cervical back: Normal range of motion and neck supple.  Skin:    General: Skin is warm and dry.  Neurological:     General: No focal deficit present.     Mental Status: She is alert and oriented to person, place, and time.     No results found for this or any previous visit (from the past 24 hour(s)).  No results found.  Assessment/Plan: Refractory  Menorrhagia Diag HS,, D&C, EAB Risks on anesthesia, infection, bleeding with injury to surrounding organs with need for repair discussed. Consent done.  Chelcie Estorga J 07/16/2023, 7:17 PM

## 2023-07-17 ENCOUNTER — Other Ambulatory Visit: Payer: Self-pay

## 2023-07-17 ENCOUNTER — Encounter (HOSPITAL_BASED_OUTPATIENT_CLINIC_OR_DEPARTMENT_OTHER): Payer: Self-pay | Admitting: Obstetrics and Gynecology

## 2023-07-17 ENCOUNTER — Encounter (HOSPITAL_BASED_OUTPATIENT_CLINIC_OR_DEPARTMENT_OTHER)
Admission: RE | Disposition: A | Discharge status: Home / Self Care | Payer: Self-pay | Source: Home / Self Care | Attending: Obstetrics and Gynecology

## 2023-07-17 ENCOUNTER — Ambulatory Visit (HOSPITAL_BASED_OUTPATIENT_CLINIC_OR_DEPARTMENT_OTHER)
Admission: RE | Admit: 2023-07-17 | Discharge: 2023-07-17 | Disposition: A | Discharge status: Home / Self Care | Payer: No Typology Code available for payment source | Attending: Obstetrics and Gynecology | Admitting: Obstetrics and Gynecology

## 2023-07-17 ENCOUNTER — Ambulatory Visit (HOSPITAL_BASED_OUTPATIENT_CLINIC_OR_DEPARTMENT_OTHER): Payer: No Typology Code available for payment source | Admitting: Certified Registered Nurse Anesthetist

## 2023-07-17 DIAGNOSIS — Z9851 Tubal ligation status: Secondary | ICD-10-CM | POA: Diagnosis not present

## 2023-07-17 DIAGNOSIS — N920 Excessive and frequent menstruation with regular cycle: Secondary | ICD-10-CM | POA: Diagnosis not present

## 2023-07-17 DIAGNOSIS — Z86718 Personal history of other venous thrombosis and embolism: Secondary | ICD-10-CM | POA: Diagnosis not present

## 2023-07-17 DIAGNOSIS — Z01818 Encounter for other preprocedural examination: Secondary | ICD-10-CM

## 2023-07-17 HISTORY — PX: DILITATION & CURRETTAGE/HYSTROSCOPY WITH NOVASURE ABLATION: SHX5568

## 2023-07-17 HISTORY — DX: Cardiac arrhythmia, unspecified: I49.9

## 2023-07-17 LAB — TYPE AND SCREEN
ABO/RH(D): A POS
Antibody Screen: NEGATIVE

## 2023-07-17 LAB — CBC
HCT: 39.8 % (ref 36.0–46.0)
Hemoglobin: 12.9 g/dL (ref 12.0–15.0)
MCH: 27.9 pg (ref 26.0–34.0)
MCHC: 32.4 g/dL (ref 30.0–36.0)
MCV: 86 fL (ref 80.0–100.0)
Platelets: 251 K/uL (ref 150–400)
RBC: 4.63 MIL/uL (ref 3.87–5.11)
RDW: 12.8 % (ref 11.5–15.5)
WBC: 8.8 K/uL (ref 4.0–10.5)
nRBC: 0 % (ref 0.0–0.2)

## 2023-07-17 LAB — POCT PREGNANCY, URINE: Preg Test, Ur: NEGATIVE

## 2023-07-17 SURGERY — DILATATION & CURETTAGE/HYSTEROSCOPY WITH NOVASURE ABLATION
Anesthesia: General | Site: Uterus

## 2023-07-17 MED ORDER — LIDOCAINE 2% (20 MG/ML) 5 ML SYRINGE
INTRAMUSCULAR | Status: DC | PRN
  Administered 2023-07-17: 80 mg via INTRAVENOUS

## 2023-07-17 MED ORDER — OXYCODONE HCL 5 MG PO TABS
5.0000 mg | ORAL_TABLET | Freq: Once | ORAL | Status: AC | PRN
  Administered 2023-07-17: 5 mg via ORAL

## 2023-07-17 MED ORDER — MEPERIDINE HCL 25 MG/ML IJ SOLN: 6.2500 mg | INTRAMUSCULAR | Status: DC | PRN

## 2023-07-17 MED ORDER — ONDANSETRON HCL 4 MG/2ML IJ SOLN
INTRAMUSCULAR | Status: DC | PRN
  Administered 2023-07-17: 4 mg via INTRAVENOUS

## 2023-07-17 MED ORDER — ONDANSETRON HCL 4 MG/2ML IJ SOLN
INTRAMUSCULAR | Status: AC
  Filled 2023-07-17: qty 2

## 2023-07-17 MED ORDER — MIDAZOLAM HCL 2 MG/2ML IJ SOLN
INTRAMUSCULAR | Status: DC | PRN
  Administered 2023-07-17: 2 mg via INTRAVENOUS

## 2023-07-17 MED ORDER — ACETAMINOPHEN 500 MG PO TABS
ORAL_TABLET | ORAL | Status: AC
  Filled 2023-07-17: qty 2

## 2023-07-17 MED ORDER — PROPOFOL 10 MG/ML IV BOLUS
INTRAVENOUS | Status: AC
  Filled 2023-07-17: qty 20

## 2023-07-17 MED ORDER — LACTATED RINGERS IV SOLN: INTRAVENOUS | Status: DC

## 2023-07-17 MED ORDER — EPHEDRINE 5 MG/ML INJ
INTRAVENOUS | Status: AC
  Filled 2023-07-17: qty 5

## 2023-07-17 MED ORDER — KETOROLAC TROMETHAMINE 30 MG/ML IJ SOLN
INTRAMUSCULAR | Status: DC | PRN
Start: 2023-07-17 — End: 2023-07-17
  Administered 2023-07-17: 30 mg via INTRAVENOUS

## 2023-07-17 MED ORDER — DEXAMETHASONE SODIUM PHOSPHATE 10 MG/ML IJ SOLN
INTRAMUSCULAR | Status: AC
  Filled 2023-07-17: qty 1

## 2023-07-17 MED ORDER — HYDROMORPHONE HCL 1 MG/ML IJ SOLN
INTRAMUSCULAR | Status: AC
  Filled 2023-07-17: qty 1

## 2023-07-17 MED ORDER — LIDOCAINE HCL (PF) 2 % IJ SOLN
INTRAMUSCULAR | Status: AC
  Filled 2023-07-17: qty 5

## 2023-07-17 MED ORDER — DEXAMETHASONE SODIUM PHOSPHATE 10 MG/ML IJ SOLN
INTRAMUSCULAR | Status: DC | PRN
  Administered 2023-07-17: 10 mg via INTRAVENOUS

## 2023-07-17 MED ORDER — DROPERIDOL 2.5 MG/ML IJ SOLN: 0.6250 mg | Freq: Once | INTRAMUSCULAR | Status: DC | PRN

## 2023-07-17 MED ORDER — CEFAZOLIN SODIUM-DEXTROSE 2-4 GM/100ML-% IV SOLN
INTRAVENOUS | Status: AC
  Filled 2023-07-17: qty 100

## 2023-07-17 MED ORDER — ACETAMINOPHEN 500 MG PO TABS
1000.0000 mg | ORAL_TABLET | Freq: Once | ORAL | Status: AC
  Administered 2023-07-17: 1000 mg via ORAL

## 2023-07-17 MED ORDER — SCOPOLAMINE 1 MG/3DAYS TD PT72
MEDICATED_PATCH | TRANSDERMAL | Status: AC
  Filled 2023-07-17: qty 1

## 2023-07-17 MED ORDER — FENTANYL CITRATE (PF) 250 MCG/5ML IJ SOLN
INTRAMUSCULAR | Status: DC | PRN
  Administered 2023-07-17: 50 ug via INTRAVENOUS
  Administered 2023-07-17: 25 ug via INTRAVENOUS

## 2023-07-17 MED ORDER — HYDROMORPHONE HCL 1 MG/ML IJ SOLN
0.2500 mg | INTRAMUSCULAR | Status: DC | PRN
  Administered 2023-07-17 (×2): 0.5 mg via INTRAVENOUS

## 2023-07-17 MED ORDER — KETOROLAC TROMETHAMINE 30 MG/ML IJ SOLN
INTRAMUSCULAR | Status: AC
  Filled 2023-07-17: qty 1

## 2023-07-17 MED ORDER — OXYCODONE HCL 5 MG PO TABS
ORAL_TABLET | ORAL | Status: AC
  Filled 2023-07-17: qty 1

## 2023-07-17 MED ORDER — BUPIVACAINE HCL (PF) 0.25 % IJ SOLN
INTRAMUSCULAR | Status: DC | PRN
  Administered 2023-07-17: 20 mL

## 2023-07-17 MED ORDER — MIDAZOLAM HCL 2 MG/2ML IJ SOLN
INTRAMUSCULAR | Status: AC
  Filled 2023-07-17: qty 2

## 2023-07-17 MED ORDER — SODIUM CHLORIDE 0.9 % IR SOLN
Status: DC | PRN
  Administered 2023-07-17: 3000 mL

## 2023-07-17 MED ORDER — POVIDONE-IODINE 10 % EX SWAB: 2.0000 | Freq: Once | CUTANEOUS | Status: DC

## 2023-07-17 MED ORDER — OXYCODONE HCL 5 MG/5ML PO SOLN: 5.0000 mg | Freq: Once | ORAL | Status: AC | PRN

## 2023-07-17 MED ORDER — SCOPOLAMINE 1 MG/3DAYS TD PT72
1.0000 | MEDICATED_PATCH | TRANSDERMAL | Status: DC
  Administered 2023-07-17: 1.5 mg via TRANSDERMAL

## 2023-07-17 MED ORDER — TRAMADOL HCL 50 MG PO TABS: 50.0000 mg | ORAL_TABLET | Freq: Four times a day (QID) | ORAL | 0 refills | Status: DC | PRN

## 2023-07-17 MED ORDER — FENTANYL CITRATE (PF) 100 MCG/2ML IJ SOLN
INTRAMUSCULAR | Status: AC
  Filled 2023-07-17: qty 2

## 2023-07-17 MED ORDER — PROPOFOL 1000 MG/100ML IV EMUL
INTRAVENOUS | Status: AC
  Filled 2023-07-17: qty 100

## 2023-07-17 MED ORDER — PROPOFOL 10 MG/ML IV BOLUS
INTRAVENOUS | Status: DC | PRN
  Administered 2023-07-17: 200 mg via INTRAVENOUS

## 2023-07-17 MED ORDER — CEFAZOLIN SODIUM-DEXTROSE 2-4 GM/100ML-% IV SOLN
2.0000 g | INTRAVENOUS | Status: AC
  Administered 2023-07-17: 2 g via INTRAVENOUS

## 2023-07-17 SURGICAL SUPPLY — 18 items
ABLATOR SURESOUND NOVASURE (ABLATOR) ×1 IMPLANT
CATH ROBINSON RED A/P 16FR (CATHETERS) ×1 IMPLANT
DRSG TELFA 3X8 NADH STRL (GAUZE/BANDAGES/DRESSINGS) ×1 IMPLANT
GAUZE 4X4 16PLY ~~LOC~~+RFID DBL (SPONGE) ×1 IMPLANT
GLOVE BIO SURGEON STRL SZ7.5 (GLOVE) ×1 IMPLANT
GLOVE BIOGEL PI IND STRL 7.0 (GLOVE) ×1 IMPLANT
GOWN STRL REUS W/TWL LRG LVL3 (GOWN DISPOSABLE) ×2 IMPLANT
IV NS IRRIG 3000ML ARTHROMATIC (IV SOLUTION) IMPLANT
KIT PROCEDURE FLUENT (KITS) ×1 IMPLANT
KIT TURNOVER CYSTO (KITS) ×1 IMPLANT
NS IRRIG 500ML POUR BTL (IV SOLUTION) IMPLANT
PACK VAGINAL MINOR WOMEN LF (CUSTOM PROCEDURE TRAY) ×1 IMPLANT
PAD OB MATERNITY 4.3X12.25 (PERSONAL CARE ITEMS) ×1 IMPLANT
PAD PREP 24X48 CUFFED NSTRL (MISCELLANEOUS) ×1 IMPLANT
SCRUB CHG 4% DYNA-HEX 4OZ (MISCELLANEOUS) ×1 IMPLANT
SEAL ROD LENS SCOPE MYOSURE (ABLATOR) ×1 IMPLANT
SLEEVE SCD COMPRESS KNEE MED (STOCKING) ×1 IMPLANT
TOWEL OR 17X24 6PK STRL BLUE (TOWEL DISPOSABLE) ×1 IMPLANT

## 2023-07-17 NOTE — Discharge Instructions (Addendum)
No NSAID until after 6:30 p.m.  No Tylenol until 5:00 p.m.   Post Anesthesia Home Care Instructions  Activity: Get plenty of rest for the remainder of the day. A responsible individual must stay with you for 24 hours following the procedure.  For the next 24 hours, DO NOT: -Drive a car -Advertising copywriter -Drink alcoholic beverages -Take any medication unless instructed by your physician -Make any legal decisions or sign important papers.  Meals: Start with liquid foods such as gelatin or soup. Progress to regular foods as tolerated. Avoid greasy, spicy, heavy foods. If nausea and/or vomiting occur, drink only clear liquids until the nausea and/or vomiting subsides. Call your physician if vomiting continues.  Special Instructions/Symptoms: Your throat may feel dry or sore from the anesthesia or the breathing tube placed in your throat during surgery. If this causes discomfort, gargle with warm salt water. The discomfort should disappear within 24 hours.  If you had a scopolamine patch placed behind your ear for the management of post- operative nausea and/or vomiting:  1. The medication in the patch is effective for 72 hours, after which it should be removed.  Wrap patch in a tissue and discard in the trash. Wash hands thoroughly with soap and water. 2. You may remove the patch earlier than 72 hours if you experience unpleasant side effects which may include dry mouth, dizziness or visual disturbances. 3. Avoid touching the patch. Wash your hands with soap and water after contact with the patch.

## 2023-07-17 NOTE — Anesthesia Procedure Notes (Signed)
Procedure Name: LMA Insertion Date/Time: 07/17/2023 12:13 PM  Performed by: Dairl Ponder, CRNAPre-anesthesia Checklist: Patient identified, Emergency Drugs available, Suction available and Patient being monitored Patient Re-evaluated:Patient Re-evaluated prior to induction Oxygen Delivery Method: Circle System Utilized Preoxygenation: Pre-oxygenation with 100% oxygen Induction Type: IV induction Ventilation: Mask ventilation without difficulty LMA: LMA inserted LMA Size: 4.0 Number of attempts: 1 Airway Equipment and Method: Bite block Placement Confirmation: positive ETCO2 Tube secured with: Tape Dental Injury: Teeth and Oropharynx as per pre-operative assessment

## 2023-07-17 NOTE — Op Note (Unsigned)
NAME: Deborah Petty, LEES MEDICAL RECORD NO: [id number] ACCOUNT NO: [id number] DATE OF BIRTH: 04-Apr-1981 FACILITY: [facility] LOCATION: [facility] PHYSICIAN: Lenoard Aden, MD  Operative Report   DATE OF PROCEDURE: 07/17/2023  PREOPERATIVE DIAGNOSIS:  Menorrhagia refractory to medical therapy.  POSTOPERATIVE DIAGNOSIS:  Menorrhagia refractory to medical therapy.  PROCEDURE:  Diagnostic hysteroscopy, D and C, NovaSure endometrial ablation.  SURGEON:  Lenoard Aden, MD  ASSISTANT:  None.  ANESTHESIA:  General, local.  ESTIMATED BLOOD LOSS:  Less than 50 mL.  COMPLICATIONS: None.  FLUID DEFICIT: Less than 10 mL.  DISPOSITION:  The patient recovery in good condition.  BRIEF OPERATIVE NOTE:  After being apprised of the risks of anesthesia, infection, bleeding, injury to surrounding organs, possible need for repair, delayed versus immediate complications including bowel and bladder injury with possible need for repair,  the patient was brought to the operating room where she was administered general anesthetic without complications.  Prepped and draped in the usual sterile fashion, catheterized until the bladder was empty.  Exam under anesthesia reveals a small  anteflexed uterus and no adnexal masses.  Retractor placed.  Cervix grasped on anterior lip with a single tooth tenaculum.  Dilute Marcaine solution placed.  Standard paracervical block, 20 mL total.  Cervix easily dilated up to a #21 Pratt dilator.   Hysteroscope placed.  Visualization reveals a normal endometrial cavity, bilateral normal tubal ostia.  No evidence of uterine perforation.  EMC is then collected using sharp curettage in a 4-quadrant method.  Specimen sent to pathology.  At this time,  NovaSure device was placed, seated to a length of 5.5 and a width of 3, initiated after negative CO2 test for 66 seconds at 82 watts for the NovaSure procedure.  The device was removed intact and inspected and found to be intact and  working properly.  At  this time, hysteroscope confirmed a well ablated endometrial cavity and no evidence of perforation.  The patient tolerated the procedure well.  All instruments were removed.  She was transferred to recovery in good condition.   PUS D: 07/17/2023 12:38:39 pm T: 07/17/2023 12:53:00 pm  JOB: [id number]/ [id number]

## 2023-07-17 NOTE — Transfer of Care (Signed)
Immediate Anesthesia Transfer of Care Note  Patient: Deborah Petty  Procedure(s) Performed: DILATATION & CURETTAGE/HYSTEROSCOPY WITH NOVASURE ABLATION (Uterus)  Patient Location: PACU  Anesthesia Type:General  Level of Consciousness: awake, alert , and oriented  Airway & Oxygen Therapy: Patient Spontanous Breathing  Post-op Assessment: Report given to RN and Post -op Vital signs reviewed and stable  Post vital signs: Reviewed and stable  Last Vitals:  Vitals Value Taken Time  BP 100/69 07/17/23 1248  Temp 36.4 C 07/17/23 1248  Pulse 75 07/17/23 1249  Resp 18 07/17/23 1249  SpO2 98 % 07/17/23 1249  Vitals shown include unfiled device data.  Last Pain:  Vitals:   07/17/23 1035  TempSrc: Oral  PainSc: 0-No pain      Patients Stated Pain Goal: 5 (07/17/23 1035)  Complications: No notable events documented.

## 2023-07-17 NOTE — Anesthesia Preprocedure Evaluation (Addendum)
Anesthesia Evaluation  Patient identified by MRN, date of birth, ID band Patient awake    Reviewed: Allergy & Precautions, NPO status , Patient's Chart, lab work & pertinent test results  Airway Mallampati: I  TM Distance: >3 FB Neck ROM: Full    Dental no notable dental hx. (+) Dental Advisory Given, Teeth Intact   Pulmonary neg pulmonary ROS   Pulmonary exam normal breath sounds clear to auscultation       Cardiovascular Normal cardiovascular exam+ dysrhythmias  Rhythm:Regular Rate:Normal     Neuro/Psych  Headaches PSYCHIATRIC DISORDERS Anxiety Depression       GI/Hepatic negative GI ROS, Neg liver ROS,,,  Endo/Other  negative endocrine ROS    Renal/GU negative Renal ROS     Musculoskeletal  (+) Arthritis ,    Abdominal   Peds  Hematology negative hematology ROS (+)   Anesthesia Other Findings   Reproductive/Obstetrics                             Anesthesia Physical Anesthesia Plan  ASA: 2  Anesthesia Plan: General   Post-op Pain Management: Tylenol PO (pre-op)* and Toradol IV (intra-op)*   Induction: Intravenous  PONV Risk Score and Plan: 4 or greater and Ondansetron, Dexamethasone, Treatment may vary due to age or medical condition, Midazolam and Scopolamine patch - Pre-op  Airway Management Planned: LMA  Additional Equipment:   Intra-op Plan:   Post-operative Plan: Extubation in OR  Informed Consent: I have reviewed the patients History and Physical, chart, labs and discussed the procedure including the risks, benefits and alternatives for the proposed anesthesia with the patient or authorized representative who has indicated his/her understanding and acceptance.     Dental advisory given  Plan Discussed with: CRNA  Anesthesia Plan Comments:         Anesthesia Quick Evaluation

## 2023-07-17 NOTE — Op Note (Signed)
07/17/2023  12:35 PM  PATIENT:  Deborah Petty  41 y.o. female  PRE-OPERATIVE DIAGNOSIS:  Menorrhagia  POST-OPERATIVE DIAGNOSIS:  same  PROCEDURE:  Procedure(s): DILATATION & CURETTAGE HYSTEROSCOPY WITH NOVASURE ABLATION  SURGEON:  Surgeon(s): Olivia Mackie, MD  ASSISTANTS: none   ANESTHESIA:   local and general  ESTIMATED BLOOD LOSS: 5 mL   DRAINS: none   LOCAL MEDICATIONS USED:  MARCAINE    and Amount: 20 ml  SPECIMEN:  Source of Specimen:  emc  DISPOSITION OF SPECIMEN:  PATHOLOGY  COUNTS:  YES  DICTATION #: [id number]  PLAN OF CARE: dc home  PATIENT DISPOSITION:  PACU - hemodynamically stable.

## 2023-07-18 LAB — SURGICAL PATHOLOGY

## 2023-07-18 NOTE — Anesthesia Postprocedure Evaluation (Signed)
Anesthesia Post Note  Patient: Deborah Petty  Procedure(s) Performed: DILATATION & CURETTAGE/HYSTEROSCOPY WITH NOVASURE ABLATION (Uterus)     Patient location during evaluation: PACU Anesthesia Type: General Level of consciousness: sedated and patient cooperative Pain management: pain level controlled Vital Signs Assessment: post-procedure vital signs reviewed and stable Respiratory status: spontaneous breathing Cardiovascular status: stable Anesthetic complications: no   No notable events documented.  Last Vitals:  Vitals:   07/17/23 1345 07/17/23 1425  BP: (!) 91/57 107/73  Pulse: 70 78  Resp: 16 16  Temp: 36.6 C   SpO2: 96% 96%    Last Pain:  Vitals:   07/17/23 1425  TempSrc:   PainSc: 4                  [name] [employer]

## 2023-07-19 ENCOUNTER — Encounter (HOSPITAL_BASED_OUTPATIENT_CLINIC_OR_DEPARTMENT_OTHER): Payer: Self-pay | Admitting: Obstetrics and Gynecology

## 2023-07-26 ENCOUNTER — Ambulatory Visit: Payer: No Typology Code available for payment source | Attending: Cardiology | Admitting: Cardiology

## 2023-07-26 ENCOUNTER — Encounter: Payer: Self-pay | Admitting: Cardiology

## 2023-07-26 VITALS — BP 100/74 | HR 72 | Ht 66.0 in | Wt 185.6 lb

## 2023-07-26 DIAGNOSIS — Z79899 Other long term (current) drug therapy: Secondary | ICD-10-CM | POA: Diagnosis not present

## 2023-07-26 DIAGNOSIS — I493 Ventricular premature depolarization: Secondary | ICD-10-CM

## 2023-07-26 NOTE — Patient Instructions (Signed)
Medication Instructions:  Your physician recommends that you continue on your current medications as directed. Please refer to the Current Medication list given to you today.  *If you need a refill on your cardiac medications before your next appointment, please call your pharmacy*   Lab Work: None   Testing/Procedures: None   Follow-Up: At [facility], you and your health needs are our priority.  As part of our continuing mission to provide you with exceptional heart care, we have created designated Provider Care Teams.  These Care Teams include your primary Cardiologist (physician) and Advanced Practice Providers (APPs -  Physician Assistants and Nurse Practitioners) who all work together to provide you with the care you need, when you need it.   Your next appointment:   1 year(s)  Provider:   Kardie Tobb, DO   

## 2023-07-26 NOTE — Progress Notes (Signed)
Cardiology Office Note:    Date:  07/26/2023   ID:  Deborah Petty, Baro 25-Apr-1981, MRN [id number]  PCP:  Philemon Kingdom, MD  Cardiologist:  Thomasene Ripple, DO  Electrophysiologist:  None   Referring MD: Philemon Kingdom, MD   " I am doing will"  History of Present Illness:    Deborah Petty is a 42 y.o. female with hx  of DVT 6 weeks after delivery in August 2021 was treated with Xarelto for 4 months and then stop, recently diagnosed thyroid nodule.. The patient presented presented to be evaluated for palpitation and shortness of breath.  During her initial visit we will get an echocardiogram as well I placed a monitor on the patient.  She was also referred to hematology giving history of DVT post pregnancy and female with Xarelto.   She presents for follow-up after a recent episode of chest pain. The pain, initially thought to be acid reflux, was located on the left side of the chest and did not respond to antacids. The episode lasted for two days and has not recurred since. The patient sought medical attention and underwent a cardiac CT scan. The patient also mentions occasional recurrence of the chest pain, but not as severe as the initial episode.   Past Medical History:  Diagnosis Date   Abnormal thyroid blood test 03/25/2013   Anxiety    Breech presentation 06/07/2016   Chronic right-sided low back pain 09/18/2018   Complication of anesthesia    review chart from last CS.  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   Insomnia    Lipoma of lower back 10/24/2017   Medical history non-contributory    Migraines 03/25/2013   Paresthesia of right foot 08/08/2018   Postpartum care following cesarean delivery (9/5) 06/07/2016   Previous cesarean delivery affecting  pregnancy 05/17/2020   Previous cesarean section 05/17/2020   Ptosis of left eyelid 12/08/2014   Status post repeat low transverse cesarean section 8/15 05/18/2020    Past Surgical History:  Procedure Laterality Date   BACK SURGERY     lipoma removal   CESAREAN SECTION N/A 06/07/2016   Procedure: PRIMARY CESAREAN SECTION - BREECH PRESENTATION;  Surgeon: Olivia Mackie, MD;  Location: [facility];  Service: Obstetrics;  Laterality: N/A;  EDD 06/07/2016   CESAREAN SECTION N/A 05/17/2020   Procedure: Repeat CESAREAN SECTION;  Surgeon: Olivia Mackie, MD;  Location: [facility];  Service: Obstetrics;  Laterality: N/A;  EDD: 05/23/20   DILITATION & CURRETTAGE/HYSTROSCOPY WITH NOVASURE ABLATION N/A 07/17/2023   Procedure: DILATATION & CURETTAGE/HYSTEROSCOPY WITH NOVASURE ABLATION;  Surgeon: Olivia Mackie, MD;  Location: [facility];  Service: Gynecology;  Laterality: N/A;   TONSILLECTOMY     TUBAL LIGATION  05/17/2020    Current Medications: Current Meds  Medication Sig   busPIRone (BUSPAR) 30 MG tablet 30 mg twice a day by oral route.   flecainide (TAMBOCOR) 100 MG tablet TAKE 1 TABLET BY MOUTH TWICE  DAILY   magnesium gluconate (MAGONATE) 500 MG tablet Take 500 mg by mouth at bedtime.   metoprolol succinate (TOPROL XL) 25 MG 24 hr tablet Take 0.5 tablets (12.5 mg total) by mouth daily.   Rimegepant Sulfate (NURTEC) 75 MG TBDP  Take 1 tablet (75 mg total) by mouth as needed (for migraine). Max dose 1 pill in 24 hours   traMADol (ULTRAM) 50 MG tablet Take 1 tablet (50 mg total) by mouth every 6 (six) hours as needed for moderate pain.   venlafaxine (EFFEXOR) 75 MG tablet Take 225 mg by mouth daily.     Allergies:   Rizatriptan   Social History   Socioeconomic History   Marital status: Married    Spouse name: Not on file   Number of children: Not on file   Years of education: Not on file   Highest education level: Not on file  Occupational History   Not on file   Tobacco Use   Smoking status: Never   Smokeless tobacco: Never  Substance and Sexual Activity   Alcohol use: Not Currently    Comment: socially   Drug use: No   Sexual activity: Yes    Birth control/protection: None  Other Topics Concern   Not on file  Social History Narrative   ** Merged History Encounter **       Social Determinants of Health   Financial Resource Strain: Not on file  Food Insecurity: Not on file  Transportation Needs: Not on file  Physical Activity: Not on file  Stress: Not on file  Social Connections: Not on file     Family History: The patient's family history includes COPD in her father; Diabetes in her father, maternal grandmother, and paternal grandfather; Heart attack in her maternal grandmother; Heart disease in her paternal grandfather; Hyperlipidemia in her father and mother; Hypertension in her father and mother; Osteoarthritis in her mother; Thyroid disease in her father.  ROS:   Review of Systems  Constitution: Negative for decreased appetite, fever and weight gain.  HENT: Negative for congestion, ear discharge, hoarse voice and sore throat.   Eyes: Negative for discharge, redness, vision loss in right eye and visual halos.  Cardiovascular: Negative for chest pain, dyspnea on exertion, leg swelling, orthopnea and palpitations.  Respiratory: Negative for cough, hemoptysis, shortness of breath and snoring.   Endocrine: Negative for heat intolerance and polyphagia.  Hematologic/Lymphatic: Negative for bleeding problem. Does not bruise/bleed easily.  Skin: Negative for flushing, nail changes, rash and suspicious lesions.  Musculoskeletal: Negative for arthritis, joint pain, muscle cramps, myalgias, neck pain and stiffness.  Gastrointestinal: Negative for abdominal pain, bowel incontinence, diarrhea and excessive appetite.  Genitourinary: Negative for decreased libido, genital sores and incomplete emptying.  Neurological: Negative for brief  paralysis, focal weakness, headaches and loss of balance.  Psychiatric/Behavioral: Negative for altered mental status, depression and suicidal ideas.  Allergic/Immunologic: Negative for HIV exposure and persistent infections.    EKGs/Labs/Other Studies Reviewed:    The following studies were reviewed today:   EKG:  The ekg ordered today demonstrates   Recent Labs: 04/25/2023: BUN 11; Creatinine, Ser 0.84; Potassium 3.9; Sodium 141 07/17/2023: Hemoglobin 12.9; Platelets 251  Recent Lipid Panel No results found for: "CHOL", "TRIG", "HDL", "CHOLHDL", "VLDL", "LDLCALC", "LDLDIRECT"  Physical Exam:    VS:  BP 100/74 (BP Location: Right Arm, Patient Position: Sitting, Cuff Size: Normal)   Pulse 72   Ht 5\' 6"  (1.676 m)   Wt 185 lb 9.6 oz (84.2 kg)   LMP 06/30/2023 (Approximate)   SpO2 97%   BMI 29.96 kg/m     Wt Readings from Last 3 Encounters:  07/26/23 185 lb 9.6 oz (84.2 kg)  07/17/23 181 lb 9.6 oz (82.4 kg)  04/25/23 178 lb (80.7  kg)     GEN: Well nourished, well developed in no acute distress HEENT: Normal NECK: No JVD; No carotid bruits LYMPHATICS: No lymphadenopathy CARDIAC: S1S2 noted,RRR, no murmurs, rubs, gallops RESPIRATORY:  Clear to auscultation without rales, wheezing or rhonchi  ABDOMEN: Soft, non-tender, non-distended, +bowel sounds, no guarding. EXTREMITIES: No edema, No cyanosis, no clubbing MUSCULOSKELETAL:  No deformity  SKIN: Warm and dry NEUROLOGIC:  Alert and oriented x 3, non-focal PSYCHIATRIC:  Normal affect, good insight  ASSESSMENT:    1. Frequent PVCs   2. High risk medication use    PLAN:    Chest Pain Patient reported intermittent chest pain, which was evaluated with a cardiac CT scan. No recurrence of the severe pain that prompted the scan. -Continue current management. -Return for follow-up in 1 year unless symptoms worsen or new symptoms develop.  Continue flecainide and metoprolol for frequent PVCs.  General Health  Maintenance Discussed the importance of maintaining a healthy diet to prevent the development of diabetes. -Encouraged healthy eating habits. -Return for routine health maintenance in 1 year.   The patient is in agreement with the above plan. The patient left the office in stable condition.  The patient will follow up in   Medication Adjustments/Labs and Tests Ordered: Current medicines are reviewed at length with the patient today.  Concerns regarding medicines are outlined above.  No orders of the defined types were placed in this encounter.  No orders of the defined types were placed in this encounter.   Patient Instructions  Medication Instructions:  Your physician recommends that you continue on your current medications as directed. Please refer to the Current Medication list given to you today.  *If you need a refill on your cardiac medications before your next appointment, please call your pharmacy*   Lab Work: None   Testing/Procedures: None   Follow-Up: At [facility], you and your health needs are our priority.  As part of our continuing mission to provide you with exceptional heart care, we have created designated Provider Care Teams.  These Care Teams include your primary Cardiologist (physician) and Advanced Practice Providers (APPs -  Physician Assistants and Nurse Practitioners) who all work together to provide you with the care you need, when you need it.   Your next appointment:   1 year(s)  Provider:   Thomasene Ripple, DO    Adopting a Healthy Lifestyle.  Know what a healthy weight is for you (roughly BMI <25) and aim to maintain this   Aim for 7+ servings of fruits and vegetables daily   65-80+ fluid ounces of water or unsweet tea for healthy kidneys   Limit to max 1 drink of alcohol per day; avoid smoking/tobacco   Limit animal fats in diet for cholesterol and heart health - choose grass fed whenever available   Avoid highly processed  foods, and foods high in saturated/trans fats   Aim for low stress - take time to unwind and care for your mental health   Aim for 150 min of moderate intensity exercise weekly for heart health, and weights twice weekly for bone health   Aim for 7-9 hours of sleep daily   When it comes to diets, agreement about the perfect plan isnt easy to find, even among the experts. Experts at the [facility] [employer] developed an idea known as the Healthy Eating Plate. Just imagine a plate divided into logical, healthy portions.   The emphasis is on diet quality:   Load  up on vegetables and fruits - one-half of your plate: Aim for color and variety, and remember that potatoes dont count.   Go for whole grains - one-quarter of your plate: Whole wheat, barley, wheat berries, quinoa, oats, brown rice, and foods made with them. If you want pasta, go with whole wheat pasta.   Protein power - one-quarter of your plate: Fish, chicken, beans, and nuts are all healthy, versatile protein sources. Limit red meat.   The diet, however, does go beyond the plate, offering a few other suggestions.   Use healthy plant oils, such as olive, canola, soy, corn, sunflower and peanut. Check the labels, and avoid partially hydrogenated oil, which have unhealthy trans fats.   If youre thirsty, drink water. Coffee and tea are good in moderation, but skip sugary drinks and limit milk and dairy products to one or two daily servings.   The type of carbohydrate in the diet is more important than the amount. Some sources of carbohydrates, such as vegetables, fruits, whole grains, and beans-are healthier than others.   Finally, stay active  Signed, Thomasene Ripple, DO  07/26/2023 5:22 PM    [facility]

## 2023-08-17 ENCOUNTER — Ambulatory Visit: Payer: No Typology Code available for payment source | Admitting: Adult Health

## 2023-08-17 VITALS — BP 101/67 | HR 75

## 2023-08-17 DIAGNOSIS — G43719 Chronic migraine without aura, intractable, without status migrainosus: Secondary | ICD-10-CM | POA: Diagnosis not present

## 2023-08-17 MED ORDER — ONABOTULINUMTOXINA 200 UNITS IJ SOLR
155.0000 [IU] | Freq: Once | INTRAMUSCULAR | Status: AC
Start: 2023-08-17 — End: 2023-08-17
  Administered 2023-08-17: 155 [IU] via INTRAMUSCULAR

## 2023-08-17 NOTE — Progress Notes (Signed)
Botox- 200 units x 1 vial Lot: [id number]  Expiration: 12/2025 NDC: 4259-5638-75  Bacteriostatic 0.9% Sodium Chloride- 4 mL  Lot: [id number] Expiration: 01/02/2024 NDC: 5188-4166-06   Dx: T01.601  B/B Witnessed by Delmer Islam

## 2023-08-17 NOTE — Progress Notes (Signed)
08/17/23: botox still working well. Broke her right foot and is in a boot.   05/25/23: Only one migraine since last seen. She has frequent dull headaches. 2-3 times a week. Will use OTC medicine or nurtec. Works well.   02/28/23: Reports that she has had more headaches. Uses Nurtec and OTC medication medications. Has had 4-5 migraines since the last botox injection . This time she would like the corrugators injected.   11/29/22: Reports Botox has worked well. Only two headaches this last month. Usually can take just Advil and it will resolve. Unable to tolerate Maxalt. Has nurtec but has not had to use this month.   08/30/22: First botox injection today. Having daily headache and atleast 1-2 migraine a week   BOTOX PROCEDURE NOTE FOR MIGRAINE HEADACHE    Contraindications and precautions discussed with patient(above). Aseptic procedure was observed and patient tolerated procedure. Procedure performed by Butch Penny, NP  The condition has existed for more than 6 months, and pt does not have a diagnosis of ALS, Myasthenia Gravis or Lambert-Eaton Syndrome.  Risks and benefits of injections discussed and pt agrees to proceed with the procedure.  Written consent obtained  These injections do not cause sedations or hallucinations which the oral therapies may cause.  Indication/Diagnosis: chronic migraine BOTOX(J0585) injection was performed according to protocol by Allergan. 200 units of BOTOX was dissolved into 4 cc NS.   NDC: [address]-41  Type of toxin: Botox  Botox- 200 units x 1 vial Lot: [id number]  Expiration: 12/2025 NDC: 2725-3664-40   Bacteriostatic 0.9% Sodium Chloride- 4 mL  Lot: [id number] Expiration: 01/02/2024 NDC: 9563-8756-43    Dx: P29.518   Description of procedure:  The patient was placed in a sitting position. The standard protocol was used for Botox as follows, with 5 units of Botox injected at each site:   -Procerus muscle, midline  injection  -Corrugator muscle bilateral injection   -Frontalis muscle, bilateral injection, with 2 sites each side, medial injection was performed in the upper one third of the frontalis muscle, in the region vertical from the medial inferior edge of the superior orbital rim. The lateral injection was again in the upper one third of the forehead vertically above the lateral limbus of the cornea, 1.5 cm lateral to the medial injection site.  -Temporalis muscle injection, 4 sites, bilaterally. The first injection was 3 cm above the tragus of the ear, second injection site was 1.5 cm to 3 cm up from the first injection site in line with the tragus of the ear. The third injection site was 1.5-3 cm forward between the first 2 injection sites. The fourth injection site was 1.5 cm posterior to the second injection site.  -Occipitalis muscle injection, 3 sites, bilaterally. The first injection was done one half way between the occipital protuberance and the tip of the mastoid process behind the ear. The second injection site was done lateral and superior to the first, 1 fingerbreadth from the first injection. The third injection site was 1 fingerbreadth superiorly and medially from the first injection site.  -Cervical paraspinal muscle injection, 2 sites, bilateral knee first injection site was 1 cm from the midline of the cervical spine, 3 cm inferior to the lower border of the occipital protuberance. The second injection site was 1.5 cm superiorly and laterally to the first injection site.  -Trapezius muscle injection was performed at 3 sites, bilaterally. The first injection site was in the upper trapezius muscle halfway between the inflection point of  the neck, and the acromion. The second injection site was one half way between the acromion and the first injection site. The third injection was done between the first injection site and the inflection point of the neck.   Will return for repeat injection  in 3 months.   A 200 units of Botox was used, 155 units were injected, the rest of the Botox was wasted. The patient tolerated the procedure well there were no complications of the above procedure. She did get lightheaded again during injection of cervical paraspinal muscle. Sandy RN came into the room, laid patient on the bed and she felt much better.   Butch Penny, MSN, NP-C 08/17/2023, 8:25 AM [facility] [address], [address] [place], [place] [address] [phone]

## 2023-08-29 ENCOUNTER — Other Ambulatory Visit: Payer: Self-pay

## 2023-08-29 MED ORDER — METOPROLOL SUCCINATE ER 25 MG PO TB24: 12.5000 mg | ORAL_TABLET | Freq: Every day | ORAL | 3 refills | Status: DC

## 2023-09-08 ENCOUNTER — Other Ambulatory Visit (HOSPITAL_COMMUNITY): Payer: Self-pay

## 2023-09-08 ENCOUNTER — Telehealth: Payer: Self-pay

## 2023-09-08 NOTE — Telephone Encounter (Signed)
Pharmacy Patient Advocate Encounter   Received notification from CoverMyMeds that prior authorization for Nurtec 75MG  dispersible tablets is required/requested.   Insurance verification completed.   The patient is insured through [facility] .   Per test claim: PA required; PA submitted to above mentioned insurance via CoverMyMeds Key/confirmation #/EOC [facility] Status is pending

## 2023-09-18 ENCOUNTER — Other Ambulatory Visit (HOSPITAL_COMMUNITY): Payer: Self-pay

## 2023-09-18 NOTE — Telephone Encounter (Signed)
Pharmacy Patient Advocate Encounter  Received notification from [facility] that Prior Authorization for Nurtec 75MG  dispersible tablets has been APPROVED from 09/08/2023 to 09/07/2024   PA #/Case ID/Reference #: PA Case ID #: [id number]

## 2023-11-09 NOTE — Telephone Encounter (Signed)
No action needed at this time. Closing encounter

## 2023-11-16 ENCOUNTER — Ambulatory Visit: Payer: No Typology Code available for payment source | Admitting: Adult Health

## 2023-11-22 ENCOUNTER — Ambulatory Visit: Payer: No Typology Code available for payment source | Admitting: Adult Health

## 2023-11-28 ENCOUNTER — Ambulatory Visit (INDEPENDENT_AMBULATORY_CARE_PROVIDER_SITE_OTHER): Payer: No Typology Code available for payment source | Admitting: Adult Health

## 2023-11-28 DIAGNOSIS — G43719 Chronic migraine without aura, intractable, without status migrainosus: Secondary | ICD-10-CM | POA: Diagnosis not present

## 2023-11-28 MED ORDER — ONABOTULINUMTOXINA 200 UNITS IJ SOLR
155.0000 [IU] | Freq: Once | INTRAMUSCULAR | Status: AC
Start: 2023-11-28 — End: 2023-11-28
  Administered 2023-11-28: 155 [IU] via INTRAMUSCULAR

## 2023-11-28 NOTE — Progress Notes (Signed)
 Botox- 200 units x 1 vial Lot: [id number] Expiration: 01/2026 NDC: 2956-2130-86  Bacteriostatic 0.9% Sodium Chloride- 4 mL  Lot: [id number] Expiration: 08/2024 NDC: 6295-2841-32  Dx: G40.102 B/B Witnessed by Elana Alm

## 2023-11-28 NOTE — Progress Notes (Signed)
 11/28/23: 3-4 migrines in 3 months. Able to take just ibuprofen or tylenol and it resolves.   08/17/23: botox still working well. Broke her right foot and is in a boot.   05/25/23: Only one migraine since last seen. She has frequent dull headaches. 2-3 times a week. Will use OTC medicine or nurtec. Works well.   02/28/23: Reports that she has had more headaches. Uses Nurtec and OTC medication medications. Has had 4-5 migraines since the last botox injection . This time she would like the corrugators injected.   11/29/22: Reports Botox has worked well. Only two headaches this last month. Usually can take just Advil and it will resolve. Unable to tolerate Maxalt. Has nurtec but has not had to use this month.   08/30/22: First botox injection today. Having daily headache and atleast 1-2 migraine a week   BOTOX PROCEDURE NOTE FOR MIGRAINE HEADACHE    Contraindications and precautions discussed with patient(above). Aseptic procedure was observed and patient tolerated procedure. Procedure performed by Butch Penny, NP  The condition has existed for more than 6 months, and pt does not have a diagnosis of ALS, Myasthenia Gravis or Lambert-Eaton Syndrome.  Risks and benefits of injections discussed and pt agrees to proceed with the procedure.  Written consent obtained  These injections do not cause sedations or hallucinations which the oral therapies may cause.  Indication/Diagnosis: chronic migraine BOTOX(J0585) injection was performed according to protocol by Allergan. 200 units of BOTOX was dissolved into 4 cc NS.   NDC: [address]-40  Type of toxin: Botox  Botox- 200 units x 1 vial Lot: [id number] Expiration: 01/2026 NDC: 8295-6213-08   Bacteriostatic 0.9% Sodium Chloride- 4 mL  Lot: [id number] Expiration: 08/2024 NDC: 9629-5284-13   Dx: K44.010     Description of procedure:  The patient was placed in a sitting position. The standard protocol was used for Botox as follows, with  5 units of Botox injected at each site:   -Procerus muscle, midline injection  -Corrugator muscle bilateral injection   -Frontalis muscle, bilateral injection, with 2 sites each side, medial injection was performed in the upper one third of the frontalis muscle, in the region vertical from the medial inferior edge of the superior orbital rim. The lateral injection was again in the upper one third of the forehead vertically above the lateral limbus of the cornea, 1.5 cm lateral to the medial injection site.  -Temporalis muscle injection, 4 sites, bilaterally. The first injection was 3 cm above the tragus of the ear, second injection site was 1.5 cm to 3 cm up from the first injection site in line with the tragus of the ear. The third injection site was 1.5-3 cm forward between the first 2 injection sites. The fourth injection site was 1.5 cm posterior to the second injection site.  -Occipitalis muscle injection, 3 sites, bilaterally. The first injection was done one half way between the occipital protuberance and the tip of the mastoid process behind the ear. The second injection site was done lateral and superior to the first, 1 fingerbreadth from the first injection. The third injection site was 1 fingerbreadth superiorly and medially from the first injection site.  -Cervical paraspinal muscle injection, 2 sites, bilateral knee first injection site was 1 cm from the midline of the cervical spine, 3 cm inferior to the lower border of the occipital protuberance. The second injection site was 1.5 cm superiorly and laterally to the first injection site.  -Trapezius muscle injection was performed at 3  sites, bilaterally. The first injection site was in the upper trapezius muscle halfway between the inflection point of the neck, and the acromion. The second injection site was one half way between the acromion and the first injection site. The third injection was done between the first injection site and  the inflection point of the neck.   Will return for repeat injection in 3 months.   A 200 units of Botox was used, 155 units were injected, the rest of the Botox was wasted. The patient tolerated the procedure well there were no complications of the above procedure.  Butch Penny, MSN, NP-C 11/28/2023, 8:10 AM [facility] [address] [place], [place] [address] [phone]

## 2023-12-13 ENCOUNTER — Other Ambulatory Visit: Payer: Self-pay | Admitting: *Deleted

## 2023-12-13 DIAGNOSIS — G43719 Chronic migraine without aura, intractable, without status migrainosus: Secondary | ICD-10-CM

## 2023-12-13 MED ORDER — NURTEC 75 MG PO TBDP: 75.0000 mg | ORAL_TABLET | ORAL | 3 refills | Status: DC | PRN

## 2024-01-01 ENCOUNTER — Encounter: Payer: Self-pay | Admitting: Adult Health

## 2024-01-01 NOTE — Telephone Encounter (Signed)
 Please call the patient.  We can get her in with an MD here-to evaluate and get her established.  However I do not know how quick that will be.  Please let the patient know that if her symptoms worsen or she develops new symptoms she needs to seek care in urgent care or ED.

## 2024-01-01 NOTE — Telephone Encounter (Signed)
 Spoke with patient. Her speech was clear and fluent. She would like to get checked to be sure no further testing is needed after staples are removed. She understands our office is not urgent care but we do happen to have an opening with Dr Frances Furbish for 4/2 at 10:15 am where she can meet new MD and get established. If any symptoms worsen or she develops new symptoms she needs to go to ER or urgent care. Patient was very Adult nurse. She will check in at 9:45 am.

## 2024-01-03 ENCOUNTER — Encounter: Payer: Self-pay | Admitting: Neurology

## 2024-01-03 ENCOUNTER — Ambulatory Visit (INDEPENDENT_AMBULATORY_CARE_PROVIDER_SITE_OTHER): Admitting: Neurology

## 2024-01-03 VITALS — BP 101/69 | HR 76 | Ht 66.0 in | Wt 198.2 lb

## 2024-01-03 DIAGNOSIS — S0101XS Laceration without foreign body of scalp, sequela: Secondary | ICD-10-CM

## 2024-01-03 DIAGNOSIS — G478 Other sleep disorders: Secondary | ICD-10-CM

## 2024-01-03 DIAGNOSIS — R519 Headache, unspecified: Secondary | ICD-10-CM

## 2024-01-03 DIAGNOSIS — E66811 Obesity, class 1: Secondary | ICD-10-CM

## 2024-01-03 DIAGNOSIS — R0683 Snoring: Secondary | ICD-10-CM

## 2024-01-03 DIAGNOSIS — Z9189 Other specified personal risk factors, not elsewhere classified: Secondary | ICD-10-CM

## 2024-01-03 NOTE — Patient Instructions (Signed)
 Your neurological exam is nonfocal.  Please work on caffeine reduction as excessive caffeine can drive your headaches.  It can also cause you to have sleep disturbance.  Please hydrate well with water, 3-4 bottles of water per day, 16.9 ounce size each.  Please continue to follow-up with Butch Penny, NP for your Botox injections. I will order a home sleep test to investigate obstructive sleep apnea.  As discussed, if you have obstructive sleep apnea offer you treatment with a CPAP or AutoPap machine.

## 2024-01-03 NOTE — Progress Notes (Signed)
 Subjective:    Patient ID: Deborah Petty is a 43 y.o. female.  HPI    Huston Foley, MD, PhD [facility] [address] [address] [place], [place] [address]  Deborah Petty is a 43 year old female with an underlying medical history of low back pain, neck pain, depression, anxiety, dizzy spells, DVT, hyperhidrosis, hair loss, migraine headaches (previously followed by Dr. Delena Bali, currently on Botox injections with Butch Penny, NP), and borderline obesity, who reports recurrent headaches for many years.  She recently had a head injury and scalp laceration, she went to [facility] emergency room.  Emergency room records are not available for my review today.  She had a head CT scan without contrast on 12/28/2023 and shared the CT scan report with me on her cell phone: No acute intracranial finding.  I reviewed prior records in her chart, she started seeing Dr. Delena Bali in 2023.  I reviewed notes and copied notes below for reference.    Today, 01/03/2024: She reports that she still has staples on the left parietal scalp.  She is supposed to get her staples out in 7 to 10 days.  She reports transient visual changes when she had her head injury last week.  She reports no sudden onset one-sided weakness or numbness or tingling or droopy face or slurring of speech.  She had a dizzy spell during her dentist appointment recently.  She tries to hydrate well, she drinks quite a bit of caffeine in the form of soda, 3 to 4 cans/day.  She is on Wellbutrin, Effexor, BuSpar, Toprol.  She also takes Nurtec as needed and gets 3 monthly Botox injections through this office.  She is on vitamin B12 and vitamin D supplementation.  She works from home but her schedule varies.  She may work from 10 AM to 2 PM or from 9 PM to 5 AM.  Bedtime is around 9 PM and rise time around 8 or 9 AM when she works days.  When she works nights, she may go to bed at 5:15 AM and sleep till 10.  She lives at home  with her husband and 2 boys, ages 19 and 58.  She is a non-smoker and does not drink any alcohol.  She is up-to-date with her eye exam, last exam was in August or September 2024.  She uses transition lenses.  She does not wake up rested.  She snores occasionally.  She has woken up with headaches.  She has noticed some word finding difficulty recently.  She reports having brain fog at times.  Previously (copied from previous notes for reference):   11/28/2023 Butch Penny, NP): <<3-4 migrines in 3 months. Able to take just ibuprofen or tylenol and it resolves. >>   08/17/23(Megan Ethelene Browns, NP): <<botox still working well. Broke her right foot and is in a boot. >>   05/25/23 Butch Penny, NP): << Only one migraine since last seen. She has frequent dull headaches. 2-3 times a week. Will use OTC medicine or nurtec. Works well. >>   02/28/23 Butch Penny, NP): <<Reports that she has had more headaches. Uses Nurtec and OTC medication medications. Has had 4-5 migraines since the last botox injection . This time she would like the corrugators injected. >>   11/29/22 Butch Penny, NP): << Reports Botox has worked well. Only two headaches this last month. Usually can take just Advil and it will resolve. Unable to tolerate Maxalt. Has nurtec but has not had to use this  month. >>   08/30/22 Butch Penny, NP): << First botox injection today. Having daily headache and atleast 1-2 migraine a week >>  08/15/2022 (Dr. Victorino Dike Chima):<<CC:  headaches   History provided from self   HPI:  Medical co-morbidities: anxiety, SVT   The patient presents for evaluation of headaches.. She has a history of migraines since she was a teenager. In the past 2 months she has also noticed tenderness over her scalp. She has migraines twice a week and has lower level headaches daily. She has previously tried Imitrex for her migraines but this made her feel sick so she stopped it. Currently takes Fioricet or Advil as  needed which are not very effective.   She follows with orthopedics for chronic neck pain and has recently started physical therapy.   She notes that her left eyelid "lags" while blinking, which was first noticed by a nurse around 2016. She has undergone neurological workup including single fiber EMG (2016) and MRI brain (2021) which were unremarkable. 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 She denies history of Bell's palsy.   Headache History: Onset: 2 months ago Triggers: strong smells, menstrual cycle Aura: none Location: right temple Quality/Description: Associated Symptoms:             Photophobia: yes             Phonophobia: yes             Nausea: yes Allodynia: yes Worse with activity?: yes Duration of headaches: hours   Headache days per month: 30 Migraine days per month: 8 Headache free days per month: 0   Current Treatment: Abortive Fioricet Advil   Preventative none   Prior Therapies                                 Topamax 50 mg daily - worsened headaches Effexor 225 mg daily Metoprolol 12.5 mg daily Amitriptyline Gabapentin - lack of efficacy Imitrex - side effects, lack of efficacy Fioricet - lack of efficacy Zofran - lack of efficacy Phenergan     LABS: 03/15/22: TSH, CBC, CMP wnl   IMAGING:  MRI brain 2021 normal per patient, results unavailable for review   CTH and C-spine 2003 unremarkable>> Her Past Medical History Is Significant For: Past Medical History:  Diagnosis Date   Abnormal thyroid blood test 03/25/2013   Anxiety    Breech presentation 06/07/2016   Chronic right-sided low back pain 09/18/2018   Complication of anesthesia    review chart from last CS.  Level went high couldn't see,    DDD (degenerative disc disease), cervical    Depression    post partum meds taken   Dizzy spells 03/25/2013   DVT (deep venous thrombosis) (HCC)    Left arm   Dysrhythmia    Hair loss 03/25/2013    Hyperhidrosis 02/04/2013   Insomnia    Lipoma of lower back 10/24/2017   Medical history non-contributory    Migraines 03/25/2013   Paresthesia of right foot 08/08/2018   Postpartum care following cesarean delivery (9/5) 06/07/2016   Previous cesarean delivery affecting pregnancy 05/17/2020   Previous cesarean section 05/17/2020   Ptosis of left eyelid 12/08/2014   Status post repeat low transverse cesarean section 8/15 05/18/2020    Her Past Surgical History Is Significant For: Past Surgical History:  Procedure Laterality Date   BACK SURGERY     lipoma removal  CESAREAN SECTION N/A 06/07/2016   Procedure: PRIMARY CESAREAN SECTION - BREECH PRESENTATION;  Surgeon: Olivia Mackie, MD;  Location: [facility];  Service: Obstetrics;  Laterality: N/A;  EDD 06/07/2016   CESAREAN SECTION N/A 05/17/2020   Procedure: Repeat CESAREAN SECTION;  Surgeon: Olivia Mackie, MD;  Location: [facility] LD ORS;  Service: Obstetrics;  Laterality: N/A;  EDD: 05/23/20   DILITATION & CURRETTAGE/HYSTROSCOPY WITH NOVASURE ABLATION N/A 07/17/2023   Procedure: DILATATION & CURETTAGE/HYSTEROSCOPY WITH NOVASURE ABLATION;  Surgeon: Olivia Mackie, MD;  Location: [facility];  Service: Gynecology;  Laterality: N/A;   TONSILLECTOMY     TUBAL LIGATION  05/17/2020    Her Family History Is Significant For: Family History  Problem Relation Age of Onset   Hyperlipidemia Father    Hypertension Father    Diabetes Father    Thyroid disease Father    COPD Father    Heart attack Maternal Grandmother    Diabetes Maternal Grandmother    Heart disease Paternal Grandfather    Diabetes Paternal Grandfather    Osteoarthritis Mother    Hypertension Mother    Hyperlipidemia Mother     Her Social History Is Significant For: Social History   Socioeconomic History   Marital status: Married    Spouse name: Not on file   Number of children: Not on file   Years of education: Not on file   Highest education  level: Not on file  Occupational History   Not on file  Tobacco Use   Smoking status: Never   Smokeless tobacco: Never  Substance and Sexual Activity   Alcohol use: Not Currently    Comment: socially   Drug use: No   Sexual activity: Yes    Birth control/protection: None  Other Topics Concern   Not on file  Social History Narrative   ** Merged History Encounter **       Social Drivers of Corporate investment banker Strain: Not on file  Food Insecurity: Not on file  Transportation Needs: Not on file  Physical Activity: Not on file  Stress: Not on file  Social Connections: Not on file    Her Allergies Are:  Allergies  Allergen Reactions   Rizatriptan Palpitations    Chest tightness, SOB  :   Her Current Medications Are:  Outpatient Encounter Medications as of 01/03/2024  Medication Sig   buPROPion (WELLBUTRIN XL) 300 MG 24 hr tablet Take 300 mg by mouth daily.   busPIRone (BUSPAR) 30 MG tablet 30 mg twice a day by oral route.   Cholecalciferol (VITAMIN D-3 PO) Take 2,000 Units by mouth daily.   cyanocobalamin (VITAMIN B12) 100 MCG tablet Take 100 mcg by mouth daily. 2 tablets Mon, Wed, and Friday   flecainide (TAMBOCOR) 100 MG tablet TAKE 1 TABLET BY MOUTH TWICE  DAILY   magnesium gluconate (MAGONATE) 500 MG tablet Take 500 mg by mouth at bedtime.   metoprolol succinate (TOPROL XL) 25 MG 24 hr tablet Take 0.5 tablets (12.5 mg total) by mouth daily.   oxybutynin (DITROPAN) 5 MG tablet Take 5 mg by mouth 2 (two) times daily.   Rimegepant Sulfate (NURTEC) 75 MG TBDP Take 1 tablet (75 mg total) by mouth as needed (for migraine). Max dose 1 pill in 24 hours   traMADol (ULTRAM) 50 MG tablet Take 1 tablet (50 mg total) by mouth every 6 (six) hours as needed for moderate pain.   venlafaxine (EFFEXOR) 75 MG tablet Take 225 mg by mouth daily.  No facility-administered encounter medications on file as of 01/03/2024.  :   Review of Systems:  Out of a complete 14 point review of  systems, all are reviewed and negative with the exception of these symptoms as listed below:  Review of Systems  Neurological:        Room 5  Pt is here Alone. Pt states that she had a fall last Thursday and hit her head and she still has staples in her head today. Pt states that since her fall she has been having brain fog, dizziness, and having a hard time with word finding. Pt states that her headaches are getting worse.     Objective:  Neurological Exam  Physical Exam Physical Examination:   Vitals:   01/03/24 1013  BP: 101/69  Pulse: 76    General Examination: The patient is a very pleasant 43 y.o. female in no acute distress. She appears well-developed and well-nourished and well groomed.   HEENT: Normocephalic, unremarkable staples left parietal area.  No redness or swelling or bruising noted.  Pupils are equal, round and reactive to light, extraocular tracking is good without limitation to gaze excursion or nystagmus noted.  Corrective eyeglasses in place.  No photophobia.  Funduscopic exam benign.  Hearing is grossly intact. Face is symmetric with normal facial animation. Speech is clear with no dysarthria noted. There is no hypophonia. There is no lip, neck/head, jaw or voice tremor. Neck is supple with full range of passive and active motion. There are no carotid bruits on auscultation. Oropharynx exam reveals: mild mouth dryness, adequate dental hygiene and mild airway crowding, due to small airway entry.  Mallampati class II.  Tonsils absent.  Neck circumference 14-1/4 inches.  Tongue protrudes centrally and palate elevates symmetrically.  Minimal overbite.  Chest: Clear to auscultation without wheezing, rhonchi or crackles noted.  Heart: S1+S2+0, regular and normal without murmurs, rubs or gallops noted.   Abdomen: Soft, non-tender and non-distended.  Extremities: There is no pitting edema in the distal lower extremities bilaterally.   Skin: Warm and dry without trophic  changes noted.   Musculoskeletal: exam reveals no obvious joint deformities.   Neurologically:  Mental status: The patient is awake, alert and oriented in all 4 spheres. Her immediate and remote memory, attention, language skills and fund of knowledge are appropriate. There is no evidence of aphasia, agnosia, apraxia or anomia. Speech is clear with normal prosody and enunciation. Thought process is linear. Mood is normal and affect is normal.  Cranial nerves II - XII are as described above under HEENT exam.  Motor exam: Normal bulk, strength and tone is noted. There is no obvious action or resting tremor.  No drift or rebound, no postural or intention tremor.  Fine motor skills and coordination: intact finger taps, hand movements, and rapid alternating patting with both upper extremities, normal foot taps bilaterally in the lower extremities.  Cerebellar testing: No dysmetria or intention tremor. There is no truncal or gait ataxia.  Normal finger-to-nose. Reflexes 2+ throughout, toes are downgoing bilaterally. Romberg negative.  Sensory exam: intact to light touch in the upper and lower extremities.  Gait, station and balance: She stands easily. No veering to one side is noted. No leaning to one side is noted. Posture is age-appropriate and stance is narrow based. Gait shows normal stride length and normal pace. No problems turning are noted.  Normal tandem walk.  Assessment and plan:  In summary, Denitra Sposito is a 43 year old female with an underlying medical  history of low back pain, neck pain, depression, anxiety, dizzy spells, DVT, hyperhidrosis, hair loss, migraine headaches (previously followed by Dr. Delena Bali, currently on Botox injections with Butch Penny, NP), and borderline obesity, who presents for evaluation of her head aches and recent head injury.  She has a nonfocal neurological exam, she required staples on her scalp due to a laceration.  We talked about her symptoms, she has had  some dizziness and brain fog.  She is advised to keep a sleep schedule is much as possible, she does have a variable work schedule unfortunately.  She works from home.  She snores occasionally and has never had a sleep study even though she has woken up with headaches.  She is reminded to stay well-hydrated and well rested and also limit her caffeine, she does drink quite a bit of caffeine containing sodas.  She is up-to-date with her eye examination.  She is largely reassured today.  I would like to proceed with a home sleep test to evaluate her for obstructive sleep apnea.  We can consider PAP therapy even if she has mild obstructive sleep apnea given that she has nonrestorative sleep and recurrent headaches.  She would be in agreement.  To that end, I ordered a home sleep test as per her preference.  She is advised to follow-up in this clinic as scheduled for her next Botox injections with Butch Penny, NP.  I answered all her questions today and she was in agreement.  I spent 45 minutes in total face-to-face time and in reviewing records during pre-charting, more than 50% of which was spent in counseling and coordination of care, reviewing test results, reviewing medications and treatment regimen and/or in discussing or reviewing the diagnosis of recurrent headaches, the prognosis and treatment options. Pertinent laboratory and imaging test results that were available during this visit with the patient were reviewed by me and considered in my medical decision making (see chart for details).

## 2024-01-08 ENCOUNTER — Other Ambulatory Visit: Payer: Self-pay

## 2024-01-08 MED ORDER — FLECAINIDE ACETATE 100 MG PO TABS
100.0000 mg | ORAL_TABLET | Freq: Two times a day (BID) | ORAL | 1 refills | Status: DC
Start: 2024-01-08 — End: 2024-06-28

## 2024-02-01 ENCOUNTER — Telehealth: Payer: Self-pay | Admitting: Adult Health

## 2024-02-01 NOTE — Telephone Encounter (Signed)
 Received auth via [facility] website, pt will continue to be buy/bill.  Auth#: [id number] (02/01/24-01/31/25)

## 2024-02-27 NOTE — Progress Notes (Unsigned)
 02/28/24: only 2 migraines in 3 months. For minor headaches- able to use OTC medication.  Uses Nurtec for migraines.  Reports that it works well.  Denies aura.  Denies any numbness tingling or weakness with her headaches.  Did have a fall and suffered a concussion.  Her primary care has been managing this.  Did discuss with Dr. Esteban Heinrich contraindications to doing Botox  with a concussion  11/28/23: 3-4 migrines in 3 months. Able to take just ibuprofen  or tylenol  and it resolves.   08/17/23: botox  still working well. Broke her right foot and is in a boot.   05/25/23: Only one migraine since last seen. She has frequent dull headaches. 2-3 times a week. Will use OTC medicine or nurtec. Works well.   02/28/23: Reports that she has had more headaches. Uses Nurtec and OTC medication medications. Has had 4-5 migraines since the last botox  injection . This time she would like the corrugators injected.   11/29/22: Reports Botox  has worked well. Only two headaches this last month. Usually can take just Advil  and it will resolve. Unable to tolerate Maxalt . Has nurtec but has not had to use this month.   08/30/22: First botox  injection today. Having daily headache and atleast 1-2 migraine a week   BOTOX  PROCEDURE NOTE FOR MIGRAINE HEADACHE    Contraindications and precautions discussed with patient(above). Aseptic procedure was observed and patient tolerated procedure. Procedure performed by Clem Currier, NP  The condition has existed for more than 6 months, and pt does not have a diagnosis of ALS, Myasthenia Gravis or Lambert-Eaton Syndrome.  Risks and benefits of injections discussed and pt agrees to proceed with the procedure.  Written consent obtained  These injections do not cause sedations or hallucinations which the oral therapies may cause.  Indication/Diagnosis: chronic migraine BOTOX (Z6109) injection was performed according to protocol by [facility]. 200 units of BOTOX  was dissolved into 4 cc  NS.   NDC: [address]-19  Type of toxin: Botox   Botox - 200 units x 1 vial Lot: [id number]  Expiration: 07/2026 NDC: 2130-8657-84   Bacteriostatic 0.9% Sodium Chloride - 4 mL  Lot: [id number] Expiration: 03/2025 NDC: 2841-3244-01    Dx: U27.253       Description of procedure:  The patient was placed in a sitting position. The standard protocol was used for Botox  as follows, with 5 units of Botox  injected at each site:   -Procerus muscle, midline injection  -Corrugator muscle bilateral injection   -Frontalis muscle, bilateral injection, with 2 sites each side, medial injection was performed in the upper one third of the frontalis muscle, in the region vertical from the medial inferior edge of the superior orbital rim. The lateral injection was again in the upper one third of the forehead vertically above the lateral limbus of the cornea, 1.5 cm lateral to the medial injection site.  -Temporalis muscle injection, 4 sites, bilaterally. The first injection was 3 cm above the tragus of the ear, second injection site was 1.5 cm to 3 cm up from the first injection site in line with the tragus of the ear. The third injection site was 1.5-3 cm forward between the first 2 injection sites. The fourth injection site was 1.5 cm posterior to the second injection site.  -Occipitalis muscle injection, 3 sites, bilaterally. The first injection was done one half way between the occipital protuberance and the tip of the mastoid process behind the ear. The second injection site was done lateral and superior to the first, 1 fingerbreadth from  the first injection. The third injection site was 1 fingerbreadth superiorly and medially from the first injection site.  -Cervical paraspinal muscle injection, 2 sites, bilateral knee first injection site was 1 cm from the midline of the cervical spine, 3 cm inferior to the lower border of the occipital protuberance. The second injection site was 1.5 cm superiorly and  laterally to the first injection site.  -Trapezius muscle injection was performed at 3 sites, bilaterally. The first injection site was in the upper trapezius muscle halfway between the inflection point of the neck, and the acromion. The second injection site was one half way between the acromion and the first injection site. The third injection was done between the first injection site and the inflection point of the neck.   Will return for repeat injection in 3 months.   A 200 units of Botox  was used, 155 units were injected, the rest of the Botox  was wasted. The patient tolerated the procedure well there were no complications of the above procedure.   Clem Currier, MSN, NP-C 02/27/2024, 3:33 PM [facility] [address], [address] [place], [place] [address] [phone]

## 2024-02-28 ENCOUNTER — Encounter: Payer: Self-pay | Admitting: Adult Health

## 2024-02-28 ENCOUNTER — Ambulatory Visit (INDEPENDENT_AMBULATORY_CARE_PROVIDER_SITE_OTHER): Payer: No Typology Code available for payment source | Admitting: Adult Health

## 2024-02-28 VITALS — BP 105/65 | HR 74

## 2024-02-28 DIAGNOSIS — G43719 Chronic migraine without aura, intractable, without status migrainosus: Secondary | ICD-10-CM | POA: Diagnosis not present

## 2024-02-28 MED ORDER — ONABOTULINUMTOXINA 200 UNITS IJ SOLR
155.0000 [IU] | Freq: Once | INTRAMUSCULAR | Status: AC
  Administered 2024-02-28: 155 [IU] via INTRAMUSCULAR

## 2024-02-28 NOTE — Progress Notes (Signed)
 Botox - 200 units x 1 vial Lot: [id number]  Expiration: 07/2026 NDC: 0865-7846-96  Bacteriostatic 0.9% Sodium Chloride - 4 mL  Lot: [id number] Expiration: 03/2025 NDC: 1324-4010-27   Dx: O53.664 B/B Witnessed by Sherrian Doheny

## 2024-03-21 ENCOUNTER — Other Ambulatory Visit: Payer: Self-pay | Admitting: Adult Health

## 2024-03-21 DIAGNOSIS — G43719 Chronic migraine without aura, intractable, without status migrainosus: Secondary | ICD-10-CM

## 2024-05-23 ENCOUNTER — Ambulatory Visit: Admitting: Adult Health

## 2024-05-23 NOTE — Progress Notes (Deleted)
 05/23/24:  02/28/24: only 2 migraines in 3 months. For minor headaches- able to use OTC medication.  Uses Nurtec for migraines.  Reports that it works well.  Denies aura.  Denies any numbness tingling or weakness with her headaches.  Did have a fall and suffered a concussion.  Her primary care has been managing this.  Did discuss with Dr. Thaddeus contraindications to doing Botox  with a concussion  11/28/23: 3-4 migrines in 3 months. Able to take just ibuprofen  or tylenol  and it resolves.   08/17/23: botox  still working well. Broke her right foot and is in a boot.   05/25/23: Only one migraine since last seen. She has frequent dull headaches. 2-3 times a week. Will use OTC medicine or nurtec. Works well.   02/28/23: Reports that she has had more headaches. Uses Nurtec and OTC medication medications. Has had 4-5 migraines since the last botox  injection . This time she would like the corrugators injected.   11/29/22: Reports Botox  has worked well. Only two headaches this last month. Usually can take just Advil  and it will resolve. Unable to tolerate Maxalt . Has nurtec but has not had to use this month.   08/30/22: First botox  injection today. Having daily headache and atleast 1-2 migraine a week   BOTOX  PROCEDURE NOTE FOR MIGRAINE HEADACHE    Contraindications and precautions discussed with patient(above). Aseptic procedure was observed and patient tolerated procedure. Procedure performed by Duwaine Russell, NP  The condition has existed for more than 6 months, and pt does not have a diagnosis of ALS, Myasthenia Gravis or Lambert-Eaton Syndrome.  Risks and benefits of injections discussed and pt agrees to proceed with the procedure.  Written consent obtained  These injections do not cause sedations or hallucinations which the oral therapies may cause.  Indication/Diagnosis: chronic migraine BOTOX (G9414) injection was performed according to protocol by [employer]. 200 units of BOTOX  was dissolved  into 4 cc NS.   NDC: [address]-98  Type of toxin: Botox   Botox - 200 units x 1 vial Lot: [id number]  Expiration: 07/2026 NDC: 9976-6078-97   Bacteriostatic 0.9% Sodium Chloride - 4 mL  Lot: [id number] Expiration: 03/2025 NDC: 9590-8033-97    Dx: H56.280       Description of procedure:  The patient was placed in a sitting position. The standard protocol was used for Botox  as follows, with 5 units of Botox  injected at each site:   -Procerus muscle, midline injection  -Corrugator muscle bilateral injection   -Frontalis muscle, bilateral injection, with 2 sites each side, medial injection was performed in the upper one third of the frontalis muscle, in the region vertical from the medial inferior edge of the superior orbital rim. The lateral injection was again in the upper one third of the forehead vertically above the lateral limbus of the cornea, 1.5 cm lateral to the medial injection site.  -Temporalis muscle injection, 4 sites, bilaterally. The first injection was 3 cm above the tragus of the ear, second injection site was 1.5 cm to 3 cm up from the first injection site in line with the tragus of the ear. The third injection site was 1.5-3 cm forward between the first 2 injection sites. The fourth injection site was 1.5 cm posterior to the second injection site.  -Occipitalis muscle injection, 3 sites, bilaterally. The first injection was done one half way between the occipital protuberance and the tip of the mastoid process behind the ear. The second injection site was done lateral and superior to the first, 1 fingerbreadth from  the first injection. The third injection site was 1 fingerbreadth superiorly and medially from the first injection site.  -Cervical paraspinal muscle injection, 2 sites, bilateral knee first injection site was 1 cm from the midline of the cervical spine, 3 cm inferior to the lower border of the occipital protuberance. The second injection site was 1.5 cm  superiorly and laterally to the first injection site.  -Trapezius muscle injection was performed at 3 sites, bilaterally. The first injection site was in the upper trapezius muscle halfway between the inflection point of the neck, and the acromion. The second injection site was one half way between the acromion and the first injection site. The third injection was done between the first injection site and the inflection point of the neck.   Will return for repeat injection in 3 months.   A 200 units of Botox  was used, 155 units were injected, the rest of the Botox  was wasted. The patient tolerated the procedure well there were no complications of the above procedure.   Duwaine Russell, MSN, NP-C 05/23/2024, 6:04 AM [facility] [address], [address] [place], [place] [address] [phone]

## 2024-05-24 ENCOUNTER — Ambulatory Visit: Admitting: Adult Health

## 2024-05-24 VITALS — BP 102/82 | HR 77

## 2024-05-24 DIAGNOSIS — G43719 Chronic migraine without aura, intractable, without status migrainosus: Secondary | ICD-10-CM

## 2024-05-24 MED ORDER — ONABOTULINUMTOXINA 200 UNITS IJ SOLR
155.0000 [IU] | Freq: Once | INTRAMUSCULAR | Status: AC
  Administered 2024-05-24: 155 [IU] via INTRAMUSCULAR

## 2024-05-24 NOTE — Progress Notes (Signed)
 05/24/24: Reports migraines are under good control.  She states that she is only having approximately 3 migraines a month.  For more severe headaches she will use the Nurtec with good benefit.  For minor headaches she typically can take Tylenol  and her headache resolves fairly quickly.  Denies aura, numbness tingling weakness with her headaches.  Denies any visual changes.  Returns today for an evaluation.  02/28/24: only 2 migraines in 3 months. For minor headaches- able to use OTC medication.  Uses Nurtec for migraines.  Reports that it works well.  Denies aura.  Denies any numbness tingling or weakness with her headaches.  Did have a fall and suffered a concussion.  Her primary care has been managing this.  Did discuss with Dr. Thaddeus contraindications to doing Botox  with a concussion  11/28/23: 3-4 migrines in 3 months. Able to take just ibuprofen  or tylenol  and it resolves.   08/17/23: botox  still working well. Broke her right foot and is in a boot.   05/25/23: Only one migraine since last seen. She has frequent dull headaches. 2-3 times a week. Will use OTC medicine or nurtec. Works well.   02/28/23: Reports that she has had more headaches. Uses Nurtec and OTC medication medications. Has had 4-5 migraines since the last botox  injection . This time she would like the corrugators injected.   11/29/22: Reports Botox  has worked well. Only two headaches this last month. Usually can take just Advil  and it will resolve. Unable to tolerate Maxalt . Has nurtec but has not had to use this month.   08/30/22: First botox  injection today. Having daily headache and atleast 1-2 migraine a week   BOTOX  PROCEDURE NOTE FOR MIGRAINE HEADACHE    Contraindications and precautions discussed with patient(above). Aseptic procedure was observed and patient tolerated procedure. Procedure performed by Duwaine Russell, NP  The condition has existed for more than 6 months, and pt does not have a diagnosis of ALS,  Myasthenia Gravis or Lambert-Eaton Syndrome.  Risks and benefits of injections discussed and pt agrees to proceed with the procedure.  Written consent obtained  These injections do not cause sedations or hallucinations which the oral therapies may cause.  Indication/Diagnosis: chronic migraine BOTOX (G9414) injection was performed according to protocol by [employer]. 200 units of BOTOX  was dissolved into 4 cc NS.   NDC: [address]-98  Type of toxin: Botox   Botox - 200 units x 1 vial Lot: [id number] Expiration: 08/2026 NDC: 9976-6078-97   Bacteriostatic 0.9% Sodium Chloride - 4 mL  Lot: [id number] Expiration: 08/02/2025 NDC: 9590-8033-97         Description of procedure:  The patient was placed in a sitting position. The standard protocol was used for Botox  as follows, with 5 units of Botox  injected at each site:   -Procerus muscle, midline injection  -Corrugator muscle bilateral injection   -Frontalis muscle, bilateral injection, with 2 sites each side, medial injection was performed in the upper one third of the frontalis muscle, in the region vertical from the medial inferior edge of the superior orbital rim. The lateral injection was again in the upper one third of the forehead vertically above the lateral limbus of the cornea, 1.5 cm lateral to the medial injection site.  -Temporalis muscle injection, 4 sites, bilaterally. The first injection was 3 cm above the tragus of the ear, second injection site was 1.5 cm to 3 cm up from the first injection site in line with the tragus of the ear. The third injection site was 1.5-3  cm forward between the first 2 injection sites. The fourth injection site was 1.5 cm posterior to the second injection site.  -Occipitalis muscle injection, 3 sites, bilaterally. The first injection was done one half way between the occipital protuberance and the tip of the mastoid process behind the ear. The second injection site was done lateral and superior to the  first, 1 fingerbreadth from the first injection. The third injection site was 1 fingerbreadth superiorly and medially from the first injection site.  -Cervical paraspinal muscle injection, 2 sites, bilateral knee first injection site was 1 cm from the midline of the cervical spine, 3 cm inferior to the lower border of the occipital protuberance. The second injection site was 1.5 cm superiorly and laterally to the first injection site.  -Trapezius muscle injection was performed at 3 sites, bilaterally. The first injection site was in the upper trapezius muscle halfway between the inflection point of the neck, and the acromion. The second injection site was one half way between the acromion and the first injection site. The third injection was done between the first injection site and the inflection point of the neck.   Will return for repeat injection in 3 months.   A 200 units of Botox  was used, 155 units were injected, the rest of the Botox  was wasted. The patient tolerated the procedure well there were no complications of the above procedure.   Duwaine Russell, MSN, NP-C 05/24/2024, 7:46 AM [facility] [address] [place], [place] [address] [phone]

## 2024-05-24 NOTE — Progress Notes (Signed)
 Botox - 200 units x 1 vial Lot: [id number] Expiration: 08/2026 NDC: 9976-6078-97  Bacteriostatic 0.9% Sodium Chloride - 4 mL  Lot: [id number] Expiration: 08/02/2025 NDC: 9590-8033-97  Dx: H56.280 B/B Witnessed by Nena GRADE RN

## 2024-06-03 ENCOUNTER — Other Ambulatory Visit: Payer: Self-pay | Admitting: Cardiology

## 2024-06-24 ENCOUNTER — Encounter: Payer: Self-pay | Admitting: Cardiology

## 2024-06-24 DIAGNOSIS — R002 Palpitations: Secondary | ICD-10-CM

## 2024-06-24 DIAGNOSIS — I493 Ventricular premature depolarization: Secondary | ICD-10-CM

## 2024-06-25 ENCOUNTER — Telehealth (HOSPITAL_BASED_OUTPATIENT_CLINIC_OR_DEPARTMENT_OTHER): Payer: Self-pay

## 2024-06-25 NOTE — Telephone Encounter (Signed)
   Name: Deborah Petty  DOB: 01/11/1981  MRN: [id number]  Primary Cardiologist: Kardie Tobb, DO  Chart reviewed as part of pre-operative protocol coverage. Patient has not been seen by our office in almost one year (07/2023) and last EKG was over one year ago (04/2023). Because of Deborah Petty's past medical history and time since last visit, she will require a follow-up in-office visit in order to better assess preoperative cardiovascular risk. She has an office visit with Dr. Sheena scheduled for 08/2024. If surgery can wait until after this, then pre-op risk assessment can be addressed then. If not, would move up appointment.   Pre-op covering staff: - Please schedule appointment and call patient to inform them. If patient already had an upcoming appointment within acceptable timeframe, please add pre-op clearance to the appointment notes so provider is aware. - Please contact requesting surgeon's office via preferred method (i.e, phone, fax) to inform them of need for appointment prior to surgery.   Tarun Patchell E Jilliam Bellmore, PA-C  06/25/2024, 11:00 AM

## 2024-06-25 NOTE — Telephone Encounter (Signed)
   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 Debridement; peroneus tenolysis  Date of Surgery:  Clearance TBD                                Surgeon:  Dr. Norleen Armor Surgeon's Group or Practice Name:  [facility] Phone number:  [phone] Fax number:  [phone]   Type of Clearance Requested:   - Medical    Type of Anesthesia:  General    Additional requests/questions:    SignedJolan Chancy Claros   06/25/2024, 10:30 AM

## 2024-06-25 NOTE — Telephone Encounter (Signed)
 Appontment scheduled for 06/28/2024 @ 8:50am with Deborah Alberts, NP

## 2024-06-27 ENCOUNTER — Encounter: Payer: Self-pay | Admitting: Cardiology

## 2024-06-27 NOTE — Progress Notes (Signed)
 " Cardiology Office Note    Patient Name: Deborah Petty Date of Encounter: 06/27/2024  Primary Care Provider:  Jefferey Fitch, MD Primary Cardiologist:  Dub Huntsman, DO Primary Electrophysiologist: None   Past Medical History    Past Medical History:  Diagnosis Date   Abnormal thyroid  blood test 03/25/2013   Anxiety    Breech presentation 06/07/2016   Chronic right-sided low back pain 09/18/2018   Complication of anesthesia    review chart from last CS.  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 female with a PMH of PVCs, DVT postpartum in 2021 treated with Xarelto, incomplete RBBB, migraines who presents today for preoperative clearance and annual follow-up.  Ms. Leitzke was seen initially by Dr. Huntsman in 2022 for evaluation of palpitations and shortness of breath.  She completed a 14-day ZIO that showed frequent PVCs and was started on low-dose metoprolol .  She is currently being treated with flecainide  for management.  She was seen in follow-up on 04/25/2023 with complaint of left-sided chest pain.  She described the pain as a burning and piercing sensation.  She completed a coronary CT with a calcium score of 0 and was continued on current medications.  She was last seen by Dr. Huntsman on 07/26/2023 for  follow-up.  During visit she reported occasional recurrence of chest pain but not as severe as initial episode.  She was continued on current management with metoprolol  and flecainide  for PVCs.  She suffered a scalp laceration and head injury evaluated of hospital earlier this year.  Mrs. Hairston presents today for preoperative clearance and annual follow-up.  She reports that she has been doing well since her previous follow-up. She experiences occasional palpitations, primarily at rest, such as at night or while sitting in a car. These episodes have not been severe enough to interfere with daily activities. She is currently on metoprolol  12.5 mg and flecainide  100 mg twice daily, which have been effective in managing her symptoms. A recent flecainide  level was drawn, but results are pending. She has a history of a scalp laceration and head injury earlier this year, resulting in a concussion. Initially, she experienced memory issues, but these have improved over time. She did not take any medication for the concussion due to potential cardiac effects. She has a history of deep vein thrombosis (DVT) but reports no current issues with leg swelling. She is trying to manage her weight through intermittent fasting and reducing sweets, aiming to lose weight before her upcoming surgery scheduled for November 20th. Patient denies chest pain, palpitations, dyspnea, PND, orthopnea, nausea, vomiting, dizziness, syncope, edema, weight gain, or early satiety.  Discussed the use of AI scribe software for clinical note transcription with the patient, who gave verbal consent to proceed.  History of Present Illness  All Review of Systems  Please see the history of present illness.    All other systems reviewed and are otherwise negative except as noted above.  Physical Exam    Wt Readings from Last 3 Encounters:  01/03/24 198 lb 3.2 oz (89.9 kg)  07/26/23 185 lb 9.6 oz (84.2 kg)  07/17/23 181 lb 9.6 oz (82.4 kg)    CD:Uyzmz were no vitals filed for this visit.,There is no height or weight on file to calculate BMI. GEN: Well nourished, well developed in no acute distress Neck: No JVD; No carotid bruits Pulmonary: Clear to auscultation without rales, wheezing or rhonchi  Cardiovascular: Normal rate. Regular rhythm. Normal S1. Normal S2.   Murmurs: There is no murmur.  ABDOMEN: Soft, non-tender, non-distended EXTREMITIES:  No edema; No deformity   EKG/LABS/ Recent Cardiac Studies   ECG personally reviewed by me today -sinus rhythm with incomplete RBBB and QT of 430 ms corrected and verified with calibers and no acute changes.  Risk Assessment/Calculations:          Lab Results  Component Value Date   WBC 8.8 07/17/2023   HGB 12.9 07/17/2023   HCT 39.8 07/17/2023   MCV 86.0 07/17/2023   PLT 251 07/17/2023   Lab Results  Component Value Date   CREATININE 0.84 04/25/2023   BUN 11 04/25/2023   NA 141 04/25/2023   K 3.9 04/25/2023   CL 102 04/25/2023   CO2 26 04/25/2023   No results found for: CHOL, HDL, LDLCALC, LDLDIRECT, TRIG, CHOLHDL  No results found for: HGBA1C Assessment & Plan    Assessment & Plan   1.  Preoperative clearance: - Patient's RCRI score is 0.9%  -The patient affirms she has been doing well without any new cardiac symptoms. They are able to achieve 7 METS without cardiac limitations. Therefore, based on ACC/AHA guidelines, the patient would be at acceptable risk for the planned procedure without further cardiovascular testing. The patient was advised that if she develops new symptoms prior to surgery to contact our office to arrange for a follow-up visit, and she verbalized understanding.    2.  History of PVCs: - Previous event monitor showing burden of 5.9% with PVCs and 2D echo completed showing normal EF of 60-65% with normal RV function no significant valve abnormalities. - Patient is pending previous flecainide  level which was checked this  week. - Continue flecainide  100 mg twice daily and Toprol -XL 12.5 mg daily  3.  History of DVT: - Previously on Xarelto x 4 months but discontinued due to severe bleeding - Reports no current symptoms or sedentary episodes.  4.  Incomplete RBBB: -Stable with current EKG with no complaints of dizziness or shortness of breath.  5. 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 Venlafaxine and flecainide  can prolong QT interval. No arrhythmias or PVCs on EKG. - Continue monitoring QT interval. - Review flecainide  levels once available.    Disposition: Follow-up with Kardie Tobb, DO or APP in 12 months    Signed, Wyn Raddle, Jackee Shove, NP 06/27/2024, 1:28 PM [facility] "

## 2024-06-28 ENCOUNTER — Encounter: Payer: Self-pay | Admitting: Nurse Practitioner

## 2024-06-28 ENCOUNTER — Ambulatory Visit: Attending: Nurse Practitioner | Admitting: Nurse Practitioner

## 2024-06-28 ENCOUNTER — Other Ambulatory Visit: Payer: Self-pay | Admitting: Orthopedic Surgery

## 2024-06-28 VITALS — BP 98/64 | HR 83 | Ht 66.0 in | Wt 195.4 lb

## 2024-06-28 DIAGNOSIS — R9431 Abnormal electrocardiogram [ECG] [EKG]: Secondary | ICD-10-CM

## 2024-06-28 DIAGNOSIS — Z01810 Encounter for preprocedural cardiovascular examination: Secondary | ICD-10-CM | POA: Diagnosis not present

## 2024-06-28 DIAGNOSIS — I824Y9 Acute embolism and thrombosis of unspecified deep veins of unspecified proximal lower extremity: Secondary | ICD-10-CM

## 2024-06-28 DIAGNOSIS — I493 Ventricular premature depolarization: Secondary | ICD-10-CM | POA: Diagnosis not present

## 2024-06-28 DIAGNOSIS — I4510 Unspecified right bundle-branch block: Secondary | ICD-10-CM | POA: Diagnosis not present

## 2024-06-28 NOTE — Patient Instructions (Signed)
 Medication Instructions:  Your physician recommends that you continue on your current medications as directed. Please refer to the Current Medication list given to you today. *If you need a refill on your cardiac medications before your next appointment, please call your pharmacy*  Lab Work: None ordered If you have labs (blood work) drawn today and your tests are completely normal, you will receive your results only by: MyChart Message (if you have MyChart) OR A paper copy in the mail If you have any lab test that is abnormal or we need to change your treatment, we will call you to review the results.  Testing/Procedures: None Ordered  Follow-Up: At [facility], you and your health needs are our priority.  As part of our continuing mission to provide you with exceptional heart care, our providers are all part of one team.  This team includes your primary Cardiologist (physician) and Advanced Practice Providers or APPs (Physician Assistants and Nurse Practitioners) who all work together to provide you with the care you need, when you need it.  Your next appointment:   12 month(s)  Provider:   Kardie Tobb, DO    We recommend signing up for the patient portal called MyChart.  Sign up information is provided on this After Visit Summary.  MyChart is used to connect with patients for Virtual Visits (Telemedicine).  Patients are able to view lab/test results, encounter notes, upcoming appointments, etc.  Non-urgent messages can be sent to your provider as well.   To learn more about what you can do with MyChart, go to [email or web address].   Other Instructions

## 2024-07-01 LAB — FLECAINIDE LEVEL: Flecainide: 0.81 ug/mL (ref 0.20–1.00)

## 2024-07-02 ENCOUNTER — Ambulatory Visit: Payer: Self-pay | Admitting: Cardiology

## 2024-07-09 ENCOUNTER — Other Ambulatory Visit: Payer: Self-pay

## 2024-07-09 MED ORDER — METOPROLOL SUCCINATE ER 25 MG PO TB24: 12.5000 mg | ORAL_TABLET | Freq: Every day | ORAL | 3 refills | Status: AC

## 2024-07-09 MED ORDER — FLECAINIDE ACETATE 100 MG PO TABS: 100.0000 mg | ORAL_TABLET | Freq: Two times a day (BID) | ORAL | 3 refills | Status: AC

## 2024-07-09 MED ORDER — FLECAINIDE ACETATE 100 MG PO TABS: 100.0000 mg | ORAL_TABLET | Freq: Two times a day (BID) | ORAL | 3 refills | Status: DC

## 2024-07-09 MED ORDER — METOPROLOL SUCCINATE ER 25 MG PO TB24: 12.5000 mg | ORAL_TABLET | Freq: Every day | ORAL | 3 refills | Status: DC

## 2024-07-09 NOTE — Progress Notes (Addendum)
 Refills for Metoprolol  12.5 mg daily and Flecainide  100 mg twice daily sent to [facility].

## 2024-07-09 NOTE — Addendum Note (Signed)
 Addended by: MELIDA ROLIN HERO on: 07/09/2024 05:50 PM   Modules accepted: Orders

## 2024-07-17 NOTE — Telephone Encounter (Signed)
 Submitted auth request via CMM to transition pt to SP, status is pending. Key: [id number]

## 2024-07-22 MED ORDER — ONABOTULINUMTOXINA 200 UNITS IJ SOLR: INTRAMUSCULAR | 3 refills | Status: AC

## 2024-07-22 NOTE — Addendum Note (Signed)
 Addended by: NEYSA NENA RAMAN on: 07/22/2024 10:09 AM   Modules accepted: Orders

## 2024-07-22 NOTE — Telephone Encounter (Signed)
 Done to [facility].

## 2024-07-22 NOTE — Telephone Encounter (Signed)
 Received denial because Botox  is not a covered benefit under pharmacy. Redid medical auth with [facility] as the servicing provider and received approval.  Please send rx to [facility], thank you! Auth#: [id number] (07/22/24-07/22/25)

## 2024-07-22 NOTE — Addendum Note (Signed)
 Addended by: NEYSA NENA RAMAN on: 07/22/2024 12:15 PM   Modules accepted: Orders

## 2024-08-09 ENCOUNTER — Encounter: Payer: Self-pay | Admitting: Cardiology

## 2024-08-10 ENCOUNTER — Encounter (HOSPITAL_COMMUNITY): Payer: Self-pay

## 2024-08-12 ENCOUNTER — Other Ambulatory Visit: Payer: Self-pay | Admitting: Obstetrics & Gynecology

## 2024-08-12 NOTE — Telephone Encounter (Signed)
 I see that the patient was supposed to have clearance done with Dr. Sheena on 11/20, but looks like the appointment was canceled.

## 2024-08-15 ENCOUNTER — Encounter (HOSPITAL_BASED_OUTPATIENT_CLINIC_OR_DEPARTMENT_OTHER): Payer: Self-pay | Admitting: Orthopedic Surgery

## 2024-08-16 ENCOUNTER — Telehealth: Payer: Self-pay | Admitting: Cardiology

## 2024-08-16 ENCOUNTER — Encounter (HOSPITAL_COMMUNITY): Payer: Self-pay | Admitting: Obstetrics & Gynecology

## 2024-08-16 ENCOUNTER — Encounter: Payer: Self-pay | Admitting: Adult Health

## 2024-08-16 ENCOUNTER — Ambulatory Visit: Admitting: Adult Health

## 2024-08-16 ENCOUNTER — Telehealth: Payer: Self-pay | Admitting: Adult Health

## 2024-08-16 ENCOUNTER — Telehealth (HOSPITAL_BASED_OUTPATIENT_CLINIC_OR_DEPARTMENT_OTHER): Payer: Self-pay | Admitting: *Deleted

## 2024-08-16 VITALS — BP 108/67 | HR 76

## 2024-08-16 DIAGNOSIS — G43719 Chronic migraine without aura, intractable, without status migrainosus: Secondary | ICD-10-CM | POA: Diagnosis not present

## 2024-08-16 MED ORDER — ONABOTULINUMTOXINA 200 UNITS IJ SOLR
155.0000 [IU] | Freq: Once | INTRAMUSCULAR | Status: AC
  Administered 2024-08-16: 155 [IU] via INTRAMUSCULAR

## 2024-08-16 NOTE — Progress Notes (Addendum)
 Botox - 200 units x 1 vial Lot: [id number] Expiration: 03/2026 NDC: 9976-6078-97   Bacteriostatic 0.9% Sodium Chloride - 4 mL  Lot: [id number] Expiration: 08/02/2025 NDC: 9590-8033-97   Dx: H56.280 SP Witnessed by Heather MOTE RN

## 2024-08-16 NOTE — Telephone Encounter (Signed)
 Per Ernest's note on 06/28/2024, patient is okay for surgery. It looks like he routed his note to the surgeon's office. Pre-op covering team, can you please update patient?  Will remove from preop pool.  Thank you!

## 2024-08-16 NOTE — Progress Notes (Signed)
   Established Patient Office Visit  Subjective   Patient ID: Deborah Petty, female    DOB: 06-Aug-1981  Age: 43 y.o. MRN: [id number]  Chief Complaint  Patient presents with   Botulinum Toxin Injection    Rm 4, alone. Consent  signed.     HPI    ROS    Objective:     LMP 07/30/2024 (Approximate)    Physical Exam   No results found for any visits on 08/16/24.    The ASCVD Risk score (Arnett DK, et al., 2019) failed to calculate for the following reasons:   Cannot find a previous HDL lab   Cannot find a previous total cholesterol lab    Assessment & Plan:   Problem List Items Addressed This Visit   None   No follow-ups on file.    Neysa Ask, RN

## 2024-08-16 NOTE — Telephone Encounter (Signed)
   Pre-operative Risk Assessment    Patient Name: Deborah Petty  DOB: 1981/07/22 MRN: [id number]   Date of last office visit: 06/28/24 JACKEE ALBERTS, NP Date of next office visit: NONE   Request for Surgical Clearance    Procedure: HYSTERECTOMY, TOTAL, LAPAROSCOPIC, ROBOT-ASSISTED WITH SALPINGECTOMY . B/L   Date of Surgery:  Clearance 08/23/24                                Surgeon:  DR. MODY Surgeon's Group or Practice Name:  [facility] Phone number:  [phone] Fax number:  [phone]   Type of Clearance Requested:   - Medical    Type of Anesthesia:  CHOICE   Additional requests/questions:    Bonney Niels Jest   08/16/2024, 4:03 PM

## 2024-08-16 NOTE — Progress Notes (Signed)
 Patient has surgery scheduled for Friday 08-23-2024 by Dr Barbette @ [facility] main OR for Robotic Hysterectomy.  Noted patient cardiac history and will need cardiac clearance for this surgery, she already had a in person office visit pre-op clearance for her orthopedic surgery day before on 08-22-2024 ,dated 06-28-2024 by Jackee Alberts NP.   Called Dr Barbette office and spoke to office staff informed them patient would need cardiac clearance and that patient cardiologist is Dr MARLA. Tobb with [facility].  Stated would give message to OR scheduler to call them to get clearance for this surgery.

## 2024-08-16 NOTE — Telephone Encounter (Signed)
 Pt calling to f/u on surgical clearance submitted 9/23. Pt had preop appt 06/28/24 with E. Wyn. Patients operation scheduled 11/20. Please advise.

## 2024-08-16 NOTE — Progress Notes (Signed)
 08/16/24: migraines under good control. Only had 1 migraine since last seen.  She will be having surgery next week for Jones fracture and hysterectomy.  She has gotten clearance from her anesthesiologist and both surgeons to have Botox  injections today.  05/24/24: Reports migraines are under good control.  She states that she is only having approximately 3 migraines a month.  For more severe headaches she will use the Nurtec with good benefit.  For minor headaches she typically can take Tylenol  and her headache resolves fairly quickly.  Denies aura, numbness tingling weakness with her headaches.  Denies any visual changes.  Returns today for an evaluation.  02/28/24: only 2 migraines in 3 months. For minor headaches- able to use OTC medication.  Uses Nurtec for migraines.  Reports that it works well.  Denies aura.  Denies any numbness tingling or weakness with her headaches.  Did have a fall and suffered a concussion.  Her primary care has been managing this.  Did discuss with Dr. Thaddeus contraindications to doing Botox  with a concussion  11/28/23: 3-4 migrines in 3 months. Able to take just ibuprofen  or tylenol  and it resolves.   08/17/23: botox  still working well. Broke her right foot and is in a boot.   05/25/23: Only one migraine since last seen. She has frequent dull headaches. 2-3 times a week. Will use OTC medicine or nurtec. Works well.   02/28/23: Reports that she has had more headaches. Uses Nurtec and OTC medication medications. Has had 4-5 migraines since the last botox  injection . This time she would like the corrugators injected.   11/29/22: Reports Botox  has worked well. Only two headaches this last month. Usually can take just Advil  and it will resolve. Unable to tolerate Maxalt . Has nurtec but has not had to use this month.   08/30/22: First botox  injection today. Having daily headache and atleast 1-2 migraine a week   BOTOX  PROCEDURE NOTE FOR MIGRAINE  HEADACHE    Contraindications and precautions discussed with patient(above). Aseptic procedure was observed and patient tolerated procedure. Procedure performed by Duwaine Russell, NP  The condition has existed for more than 6 months, and pt does not have a diagnosis of ALS, Myasthenia Gravis or Lambert-Eaton Syndrome.  Risks and benefits of injections discussed and pt agrees to proceed with the procedure.  Written consent obtained  These injections do not cause sedations or hallucinations which the oral therapies may cause.  Indication/Diagnosis: chronic migraine BOTOX (G9414) injection was performed according to protocol by Allergan. 200 units of BOTOX  was dissolved into 4 cc NS.   NDC: [address]-98  Type of toxin: Botox   Botox - 200 units x 1 vial Lot: [id number] Expiration: 03/2026 NDC: 9976-6078-97   Bacteriostatic 0.9% Sodium Chloride - 4 mL  Lot: [id number] Expiration: 08/02/2025 NDC: 9590-8033-97   Dx: H56.280               Description of procedure:  The patient was placed in a sitting position. The standard protocol was used for Botox  as follows, with 5 units of Botox  injected at each site:   -Procerus muscle, midline injection  -Corrugator muscle bilateral injection   -Frontalis muscle, bilateral injection, with 2 sites each side, medial injection was performed in the upper one third of the frontalis muscle, in the region vertical from the medial inferior edge of the superior orbital rim. The lateral injection was again in the upper one third of the forehead vertically above the lateral limbus of the cornea, 1.5 cm lateral to the  medial injection site.  -Temporalis muscle injection, 4 sites, bilaterally. The first injection was 3 cm above the tragus of the ear, second injection site was 1.5 cm to 3 cm up from the first injection site in line with the tragus of the ear. The third injection site was 1.5-3 cm forward between the first 2 injection sites. The fourth  injection site was 1.5 cm posterior to the second injection site.  -Occipitalis muscle injection, 3 sites, bilaterally. The first injection was done one half way between the occipital protuberance and the tip of the mastoid process behind the ear. The second injection site was done lateral and superior to the first, 1 fingerbreadth from the first injection. The third injection site was 1 fingerbreadth superiorly and medially from the first injection site.  -Cervical paraspinal muscle injection, 2 sites, bilateral knee first injection site was 1 cm from the midline of the cervical spine, 3 cm inferior to the lower border of the occipital protuberance. The second injection site was 1.5 cm superiorly and laterally to the first injection site.  -Trapezius muscle injection was performed at 3 sites, bilaterally. The first injection site was in the upper trapezius muscle halfway between the inflection point of the neck, and the acromion. The second injection site was one half way between the acromion and the first injection site. The third injection was done between the first injection site and the inflection point of the neck.   Will return for repeat injection in 3 months.   A 200 units of Botox  was used, 155 units were injected, the rest of the Botox  was wasted. The patient tolerated the procedure well there were no complications of the above procedure.   Duwaine Russell, MSN, NP-C 08/16/2024, 5:54 AM [facility] [address], [address] [place], [place] [address] [phone]

## 2024-08-16 NOTE — Telephone Encounter (Signed)
 Pt is having 2 surgeries, I placed the other surgery in and sent to preop APP> I d/w Callie Goodrich,PAC.

## 2024-08-16 NOTE — Telephone Encounter (Signed)
 Please contact patient to schedule her 12 weeks botox  appt. Thank you

## 2024-08-16 NOTE — Telephone Encounter (Signed)
 Deborah Petty,  Deborah Petty is requesting preoperative cardiac evaluation for hysterectomy.  She was recently seen by you in clinic.  Would she be acceptable risk for upcoming procedure?  Thank you for your help.  Deborah Petty. Guelda Batson NP-C     08/16/2024, 4:50 PM [facility] [address] [place], [place] [address] Office [phone]

## 2024-08-17 ENCOUNTER — Ambulatory Visit (HOSPITAL_BASED_OUTPATIENT_CLINIC_OR_DEPARTMENT_OTHER)

## 2024-08-19 ENCOUNTER — Encounter (HOSPITAL_COMMUNITY): Payer: Self-pay | Admitting: Obstetrics & Gynecology

## 2024-08-19 NOTE — Telephone Encounter (Signed)
 Patient called again to follow-up on the status of her clearance.  Patient noted clearance should be sent to [facility] and [facility].

## 2024-08-19 NOTE — Telephone Encounter (Signed)
 Patient is calling to check on status of her clearance, for her surgery this coming Friday.

## 2024-08-19 NOTE — Telephone Encounter (Signed)
 Deborah Petty   08/19/24  2:57 PM Note    [name]  From the Gyn office called to get update on Clearance    Best number [phone] Ext [phone]

## 2024-08-19 NOTE — Telephone Encounter (Signed)
 Scheduled for 11/21/24, sent MyChart msg.

## 2024-08-19 NOTE — Progress Notes (Addendum)
 Spoke w/ via phone for pre-op interview--- pt Lab needs dos----   upt      Lab results------ lab appt 08-20-2024 @ 0900 getting CBC/ BMP/ T&S Current EKG in epic/ chart dated 06-28-2024 COVID test -----patient states asymptomatic no test needed Arrive at ------- 1100 on 08-23-2024 NPO after MN NO Solid Food.  Clear liquids from MN until--- 1000 Pre-Surgery Ensure or G2: n/a  Med rec completed Medications to take morning of surgery ----- buspar, flecainide , wellbutrin, effexor, toprol / If needed nurtec, zofran , promethazine , fioricet,valium Diabetic medication ----- n/a  GLP1 agonist last dose: n/a GLP1 instructions:  Patient instructed no nail polish to be worn day of surgery Patient instructed to bring photo id and insurance card day of surgery Patient aware to have Driver (ride ) / caregiver    for 24 hours after surgery - husband, brandon Talwar Patient Special Instructions ----- will pick up written instructions at lab appt Pt will not be getting soap since will not be able to shower due to having toe fracture day before surgery.  Spoke Perianesthesia Clinical Nurse Specialist will need just the six wipes in pre-op. Pre-Op special Instructions -----  at lab appointment will have patient sign her informed consent and blood consent since will have had surgery day before surgery and she not to sign 24 hours after surgery per anesthesia guidelines . Pt surgery day before is being done at [facility] by Dr Kit , 5th digit right foot.  Pt stated surgeon is aware.   Pending cardiac clearance for this surgery.  Patient verbalized understanding of instructions that were given at this phone interview. Patient denies chest pain, sob, fever, cough at the interview.    Anesthesia review:  freq PVCs/ IRBBB/ prolonged Q-T interval on EKG  Cardiologiat:  Dr ONEIDA. [facility] 07-26-2023,  pre-op office visit 06-28-2024 for toe surgery) Echo:  02-04-2021 EKG:  06-28-2024 Event monitor:   02-04-2021 Stress test: no Cardiac cath:  no Cardiac CT:  05-02-2023

## 2024-08-19 NOTE — Telephone Encounter (Signed)
 Deborah Petty  From the Gyn office called to get update on Clearance   Best number [phone] Ext [phone]

## 2024-08-19 NOTE — Telephone Encounter (Signed)
 Jackee,  Please review preoperative cardiac clearance request and provide risk recommendation.    Thank you for your help.  Josefa HERO. Dakoda Laventure NP-C     08/19/2024, 4:50 PM [facility] [address] [place], [place] [address] Office [phone]

## 2024-08-19 NOTE — Pre-Procedure Instructions (Signed)
 Surgical Instructions  Your procedure is scheduled on :   Friday,  08-23-2024 Report to [facility] Main Entrance A at 11:00 AM, then check in the Admitting office. Any questions or running late day of surgery :  call [phone]  Questions prior to your surgery day:  call [phone], Monday -- Friday 8am - 4pm. If you experience any cold or flu symptoms such as cough, fever, chills, shortness of breath, etc. between now and you scheduled surgery, please notify your surgeon office.   Remember: Do not eat any food after midnight the night before surgery. You may have clear liquids from midnight night before surgery until 10:00 AM.   Clear liquids allowed are:  Water             Carbonated Beverages (diabetics choose diet or no sugar options)  Clear Tea ( no milk, no honey, etc.)  Black coffee ( NO MILK, CREAM OR POWDERED CREAMER OF ANY KIND)  Sport drinks, like Gatorade (diabetes choose diet or no sugar options)  NO clear liquid after 10:00 AM day of surgery.  This includes No water,  candy,  gum, and mints.  Take these medicines the morning of surgery with A SIPS OF WATER:   Buspirone (buspar) Flecainide  (tambocor ) Bupropion (wellbutrin) Venlafaxine (effexor) Metoprolol  (toprol )  May take these medicines IF NEEDED:   Rimegepant (nurtec) Ondansetron  (zofran ) Promethazine  (phenergan ) Diazepam (valium) Butalbital-acetaminophen -caffeine (fioricet)  One week prior to surgery, STOP taking any Aspirin (unless otherwise instructed by your surgeon) Aleve, Naproxen, ibuprofen , Motrin , Advil , Goody's, BC's, all herbal medications/ supplements, fish oil, and non-prescription vitamins.  Do NOT Smoke (tobacco/ vaping) and Do Not drink alcohol for 24 hours prior to your procedure.  For those patients that use a CPAP.  Please bring your CPAP/ mask/ tubing with them day of surgery . Anesthesia may ask recovery room nurse to use and if you stay the night you be asked to use it.  You will be  asked to removed any contacts, glasses, piercing's, hearing aid's, dentures/ partials prior to surgery.  Please bring cases/ container/ solution/ etc., for them day of surgery.   Patients discharged the day of surgery will NOT be allowed to drive home.  You must have responsible driver and caregiver to stay at home with you the next 24 hours.  SURGICAL WAITING ROOM VISITATION Patients may have no more than 2 support people in the waiting area - if more than 2 , these visitors may rotate.  Pre-op nurse will coordinate an appropriate time for 1 Adult support person, who may not rotate, to accompany patient in pre-op.  Aware some patients may have certain circumstances, speak to pre-op nurse day of surgery.  Children under the age 50 must have an adult with them who is not the patient and must remain in the main waiting area with an adult.  If the patient needs to stay at the hospital during part of their recovery, the visitor guidelines for inpatient rooms apply.  Please refer to the [facility] website for the visitor guidelines for any additional information.  If you received a COVID test during your pre-op visit it is requested that you wear a mask when out in public, stay away from anyone that may not be feeling well and notify your surgeon if you develop symptoms.  If you have been in contact with anyone that has tested positive in the past 10 days notify your surgeon.     [place] - Preparing for Surgery  Oral hygiene is also important in reducing the risk of infection. Remember to brush your teeth with your regular toothpaste the morning of surgery.  DO NOT shave (including legs and genital area) for at least 48 hours prior to your CHG shower.    DO NOT Apply any lotions,  powder,  oils,  deodorants (may use underarm deodorant),  cologne/  perfumes  or makeup Do Not wear jewelry /  piercing's/  metal/  permanent jewelry must be removed prior to arrival day of surgery. (No plastic  piercing) Do Not wear nail polish,  gel polish,  artificial nails, or any other type of covering on natural finger nails (toe nails are okay) Remember to brush your teeth and rinse mouth out. Put on clean / comfortable clothes. [facility] is not responsible for valuables/ personal belongings

## 2024-08-20 ENCOUNTER — Ambulatory Visit: Admitting: Adult Health

## 2024-08-20 ENCOUNTER — Other Ambulatory Visit (HOSPITAL_COMMUNITY): Payer: Self-pay

## 2024-08-20 ENCOUNTER — Encounter (HOSPITAL_COMMUNITY)
Admission: RE | Admit: 2024-08-20 | Discharge: 2024-08-20 | Disposition: A | Discharge status: Home / Self Care | Source: Ambulatory Visit | Attending: Obstetrics & Gynecology | Admitting: Obstetrics & Gynecology

## 2024-08-20 ENCOUNTER — Telehealth: Payer: Self-pay

## 2024-08-20 DIAGNOSIS — Z01812 Encounter for preprocedural laboratory examination: Secondary | ICD-10-CM | POA: Diagnosis present

## 2024-08-20 LAB — BASIC METABOLIC PANEL WITH GFR
Anion gap: 13 (ref 5–15)
BUN: 5 mg/dL — ABNORMAL LOW (ref 6–20)
CO2: 23 mmol/L (ref 22–32)
Calcium: 9 mg/dL (ref 8.9–10.3)
Chloride: 102 mmol/L (ref 98–111)
Creatinine, Ser: 0.92 mg/dL (ref 0.44–1.00)
GFR, Estimated: >60 mL/min (ref >60)
Glucose, Bld: 62 mg/dL — ABNORMAL LOW (ref 70–99)
Potassium: 3 mmol/L — ABNORMAL LOW (ref 3.5–5.1)
Sodium: 138 mmol/L (ref 135–145)

## 2024-08-20 LAB — CBC
HCT: 39.8 % (ref 36.0–46.0)
Hemoglobin: 12.8 g/dL (ref 12.0–15.0)
MCH: 26.7 pg (ref 26.0–34.0)
MCHC: 32.2 g/dL (ref 30.0–36.0)
MCV: 83.1 fL (ref 80.0–100.0)
Platelets: 310 K/uL (ref 150–400)
RBC: 4.79 MIL/uL (ref 3.87–5.11)
RDW: 13.1 % (ref 11.5–15.5)
WBC: 11.8 K/uL — ABNORMAL HIGH (ref 4.0–10.5)
nRBC: 0 % (ref 0.0–0.2)

## 2024-08-20 LAB — TYPE AND SCREEN
ABO/RH(D): A POS
Antibody Screen: NEGATIVE

## 2024-08-20 NOTE — Telephone Encounter (Signed)
Office is calling for update. Please advise 

## 2024-08-20 NOTE — Telephone Encounter (Signed)
 Preop clearance has been resent to requesting office and I called office to make them aware clearance has been refax

## 2024-08-20 NOTE — Telephone Encounter (Signed)
     Primary Cardiologist: Kardie Tobb, DO  Chart reviewed as part of pre-operative protocol coverage. Given past medical history and time since last visit, based on ACC/AHA guidelines, Shaleena Hogle would be at acceptable risk for the planned procedure without further cardiovascular testing.   She is able to achieve greater than 7 METS of physical activity.  She was recently seen in cardiology clinic on 06/28/2024.  She remained stable from a cardiac standpoint at that time.  I will route this recommendation to the requesting party via Epic fax function and remove from pre-op pool.  Please call with questions.  Josefa HERO. Shahmeer Bunn NP-C     08/20/2024, 12:24 PM [facility] [address] [place], [place] [address] Office [phone]

## 2024-08-20 NOTE — Telephone Encounter (Signed)
 Pharmacy Patient Advocate Encounter   Received notification from Fax that prior authorization for Nurtec 75mg  ODT is required/requested.   Insurance verification completed.   The patient is insured through [facility].   Per test claim: PA required; PA submitted to above mentioned insurance via Latent Key/confirmation #/EOC [id number] Status is pending

## 2024-08-20 NOTE — Telephone Encounter (Signed)
 Pharmacy Patient Advocate Encounter  Received notification from [employer] that Prior Authorization for Nurtec has been APPROVED from 08/20/2024 to 08/20/2025   PA #/Case ID/Reference #: [id number]

## 2024-08-22 ENCOUNTER — Ambulatory Visit (HOSPITAL_BASED_OUTPATIENT_CLINIC_OR_DEPARTMENT_OTHER): Admitting: Anesthesiology

## 2024-08-22 ENCOUNTER — Ambulatory Visit (HOSPITAL_BASED_OUTPATIENT_CLINIC_OR_DEPARTMENT_OTHER)

## 2024-08-22 ENCOUNTER — Encounter (HOSPITAL_COMMUNITY): Payer: Self-pay | Admitting: Obstetrics & Gynecology

## 2024-08-22 ENCOUNTER — Encounter (HOSPITAL_BASED_OUTPATIENT_CLINIC_OR_DEPARTMENT_OTHER)
Admission: RE | Disposition: A | Discharge status: Home / Self Care | Payer: Self-pay | Source: Home / Self Care | Attending: Orthopedic Surgery

## 2024-08-22 ENCOUNTER — Encounter (HOSPITAL_BASED_OUTPATIENT_CLINIC_OR_DEPARTMENT_OTHER): Payer: Self-pay | Admitting: Orthopedic Surgery

## 2024-08-22 ENCOUNTER — Ambulatory Visit (HOSPITAL_BASED_OUTPATIENT_CLINIC_OR_DEPARTMENT_OTHER)
Admission: RE | Admit: 2024-08-22 | Discharge: 2024-08-22 | Disposition: A | Discharge status: Home / Self Care | Attending: Orthopedic Surgery | Admitting: Orthopedic Surgery

## 2024-08-22 ENCOUNTER — Encounter (HOSPITAL_BASED_OUTPATIENT_CLINIC_OR_DEPARTMENT_OTHER): Admitting: Anesthesiology

## 2024-08-22 ENCOUNTER — Other Ambulatory Visit: Payer: Self-pay

## 2024-08-22 ENCOUNTER — Ambulatory Visit: Admitting: Cardiology

## 2024-08-22 DIAGNOSIS — Z01818 Encounter for other preprocedural examination: Secondary | ICD-10-CM

## 2024-08-22 DIAGNOSIS — M65971 Unspecified synovitis and tenosynovitis, right ankle and foot: Secondary | ICD-10-CM | POA: Diagnosis not present

## 2024-08-22 DIAGNOSIS — S92354A Nondisplaced fracture of fifth metatarsal bone, right foot, initial encounter for closed fracture: Secondary | ICD-10-CM | POA: Insufficient documentation

## 2024-08-22 DIAGNOSIS — Z6831 Body mass index (BMI) 31.0-31.9, adult: Secondary | ICD-10-CM | POA: Diagnosis not present

## 2024-08-22 DIAGNOSIS — I10 Essential (primary) hypertension: Secondary | ICD-10-CM | POA: Insufficient documentation

## 2024-08-22 DIAGNOSIS — R519 Headache, unspecified: Secondary | ICD-10-CM | POA: Insufficient documentation

## 2024-08-22 DIAGNOSIS — S92354K Nondisplaced fracture of fifth metatarsal bone, right foot, subsequent encounter for fracture with nonunion: Secondary | ICD-10-CM

## 2024-08-22 DIAGNOSIS — E669 Obesity, unspecified: Secondary | ICD-10-CM | POA: Diagnosis not present

## 2024-08-22 DIAGNOSIS — S86311A Strain of muscle(s) and tendon(s) of peroneal muscle group at lower leg level, right leg, initial encounter: Secondary | ICD-10-CM | POA: Insufficient documentation

## 2024-08-22 DIAGNOSIS — Z86718 Personal history of other venous thrombosis and embolism: Secondary | ICD-10-CM | POA: Diagnosis not present

## 2024-08-22 DIAGNOSIS — F419 Anxiety disorder, unspecified: Secondary | ICD-10-CM | POA: Diagnosis not present

## 2024-08-22 DIAGNOSIS — F32A Depression, unspecified: Secondary | ICD-10-CM | POA: Diagnosis not present

## 2024-08-22 DIAGNOSIS — X58XXXA Exposure to other specified factors, initial encounter: Secondary | ICD-10-CM | POA: Diagnosis not present

## 2024-08-22 DIAGNOSIS — S92354S Nondisplaced fracture of fifth metatarsal bone, right foot, sequela: Secondary | ICD-10-CM | POA: Diagnosis present

## 2024-08-22 DIAGNOSIS — S92351K Displaced fracture of fifth metatarsal bone, right foot, subsequent encounter for fracture with nonunion: Secondary | ICD-10-CM

## 2024-08-22 DIAGNOSIS — S96811A Strain of other specified muscles and tendons at ankle and foot level, right foot, initial encounter: Secondary | ICD-10-CM | POA: Insufficient documentation

## 2024-08-22 HISTORY — PX: TENOLYSIS: SHX396

## 2024-08-22 HISTORY — PX: FLEXOR TENDON REPAIR: SHX6501

## 2024-08-22 HISTORY — PX: ORIF TOE FRACTURE: SHX5032

## 2024-08-22 LAB — POCT PREGNANCY, URINE: Preg Test, Ur: NEGATIVE

## 2024-08-22 SURGERY — OPEN REDUCTION INTERNAL FIXATION (ORIF) METATARSAL (TOE) FRACTURE
Anesthesia: General | Site: Foot | Laterality: Right

## 2024-08-22 MED ORDER — PROPOFOL 10 MG/ML IV BOLUS
INTRAVENOUS | Status: DC | PRN
  Administered 2024-08-22: 130 mg via INTRAVENOUS

## 2024-08-22 MED ORDER — OXYCODONE HCL 5 MG PO TABS: 5.0000 mg | ORAL_TABLET | Freq: Four times a day (QID) | ORAL | 0 refills | Status: AC | PRN

## 2024-08-22 MED ORDER — FENTANYL CITRATE (PF) 100 MCG/2ML IJ SOLN
INTRAMUSCULAR | Status: DC | PRN
  Administered 2024-08-22: 50 ug via INTRAVENOUS

## 2024-08-22 MED ORDER — VANCOMYCIN HCL 500 MG IV SOLR
INTRAVENOUS | Status: DC | PRN
  Administered 2024-08-22: 500 mg via TOPICAL

## 2024-08-22 MED ORDER — CEFAZOLIN SODIUM-DEXTROSE 2-4 GM/100ML-% IV SOLN
2.0000 g | INTRAVENOUS | Status: AC
  Administered 2024-08-22: 2 g via INTRAVENOUS

## 2024-08-22 MED ORDER — OXYCODONE HCL 5 MG/5ML PO SOLN: 5.0000 mg | Freq: Once | ORAL | Status: DC | PRN

## 2024-08-22 MED ORDER — ONDANSETRON HCL 4 MG/2ML IJ SOLN
INTRAMUSCULAR | Status: AC
Start: 2024-08-22 — End: 2024-08-22
  Filled 2024-08-22: qty 2

## 2024-08-22 MED ORDER — ONDANSETRON HCL 4 MG/2ML IJ SOLN
INTRAMUSCULAR | Status: DC | PRN
  Administered 2024-08-22: 4 mg via INTRAVENOUS

## 2024-08-22 MED ORDER — MIDAZOLAM HCL 2 MG/2ML IJ SOLN
INTRAMUSCULAR | Status: AC
  Filled 2024-08-22: qty 2

## 2024-08-22 MED ORDER — ONDANSETRON HCL 4 MG/2ML IJ SOLN: 4.0000 mg | Freq: Once | INTRAMUSCULAR | Status: DC | PRN

## 2024-08-22 MED ORDER — ACETAMINOPHEN 500 MG PO TABS
1000.0000 mg | ORAL_TABLET | Freq: Once | ORAL | Status: AC
  Administered 2024-08-22: 1000 mg via ORAL

## 2024-08-22 MED ORDER — FENTANYL CITRATE (PF) 100 MCG/2ML IJ SOLN
50.0000 ug | Freq: Once | INTRAMUSCULAR | Status: AC
  Administered 2024-08-22: 50 ug via INTRAVENOUS

## 2024-08-22 MED ORDER — 0.9 % SODIUM CHLORIDE (POUR BTL) OPTIME
TOPICAL | Status: DC | PRN
  Administered 2024-08-22: 200 mL

## 2024-08-22 MED ORDER — LACTATED RINGERS IV SOLN: INTRAVENOUS | Status: DC

## 2024-08-22 MED ORDER — ACETAMINOPHEN 500 MG PO TABS
ORAL_TABLET | ORAL | Status: AC
  Filled 2024-08-22: qty 2

## 2024-08-22 MED ORDER — FENTANYL CITRATE (PF) 100 MCG/2ML IJ SOLN
INTRAMUSCULAR | Status: AC
  Filled 2024-08-22: qty 2

## 2024-08-22 MED ORDER — EPHEDRINE SULFATE (PRESSORS) 25 MG/5ML IV SOSY
PREFILLED_SYRINGE | INTRAVENOUS | Status: DC | PRN
  Administered 2024-08-22: 10 mg via INTRAVENOUS

## 2024-08-22 MED ORDER — SENNA 8.6 MG PO TABS: 2.0000 | ORAL_TABLET | Freq: Two times a day (BID) | ORAL | 0 refills | Status: AC

## 2024-08-22 MED ORDER — MIDAZOLAM HCL (PF) 2 MG/2ML IJ SOLN
2.0000 mg | Freq: Once | INTRAMUSCULAR | Status: AC
  Administered 2024-08-22: 2 mg via INTRAVENOUS

## 2024-08-22 MED ORDER — AMISULPRIDE (ANTIEMETIC) 5 MG/2ML IV SOLN: 10.0000 mg | Freq: Once | INTRAVENOUS | Status: DC | PRN

## 2024-08-22 MED ORDER — LIDOCAINE HCL (CARDIAC) PF 100 MG/5ML IV SOSY
PREFILLED_SYRINGE | INTRAVENOUS | Status: DC | PRN
  Administered 2024-08-22: 80 mg via INTRAVENOUS

## 2024-08-22 MED ORDER — FENTANYL CITRATE (PF) 100 MCG/2ML IJ SOLN: 25.0000 ug | INTRAMUSCULAR | Status: DC | PRN

## 2024-08-22 MED ORDER — DEXAMETHASONE SOD PHOSPHATE PF 10 MG/ML IJ SOLN
INTRAMUSCULAR | Status: DC | PRN
  Administered 2024-08-22: 10 mg via INTRAVENOUS

## 2024-08-22 MED ORDER — BUPIVACAINE HCL (PF) 0.5 % IJ SOLN
INTRAMUSCULAR | Status: DC | PRN
  Administered 2024-08-22: 10 mL via PERINEURAL

## 2024-08-22 MED ORDER — DOCUSATE SODIUM 100 MG PO CAPS: 100.0000 mg | ORAL_CAPSULE | Freq: Two times a day (BID) | ORAL | 0 refills | Status: AC

## 2024-08-22 MED ORDER — PHENYLEPHRINE HCL (PRESSORS) 10 MG/ML IV SOLN
INTRAVENOUS | Status: DC | PRN
  Administered 2024-08-22 (×4): 80 ug via INTRAVENOUS

## 2024-08-22 MED ORDER — PHENYLEPHRINE HCL-NACL 20-0.9 MG/250ML-% IV SOLN
INTRAVENOUS | Status: DC | PRN
  Administered 2024-08-22: 50 ug/min via INTRAVENOUS

## 2024-08-22 MED ORDER — OXYCODONE HCL 5 MG PO TABS: 5.0000 mg | ORAL_TABLET | Freq: Once | ORAL | Status: DC | PRN

## 2024-08-22 MED ORDER — CEFAZOLIN SODIUM-DEXTROSE 2-4 GM/100ML-% IV SOLN
INTRAVENOUS | Status: AC
Start: 2024-08-22 — End: 2024-08-22
  Filled 2024-08-22: qty 100

## 2024-08-22 MED ORDER — BUPIVACAINE LIPOSOME 1.3 % IJ SUSP
INTRAMUSCULAR | Status: DC | PRN
  Administered 2024-08-22: 10 mL via PERINEURAL

## 2024-08-22 SURGICAL SUPPLY — 61 items
BIT DRILL 1.6MM (DRILL) IMPLANT
BLADE MINI RND TIP GREEN BEAV (BLADE) IMPLANT
BLADE SURG 15 STRL LF DISP TIS (BLADE) ×4 IMPLANT
BNDG COMPR ESMARK 4X3 LF (GAUZE/BANDAGES/DRESSINGS) IMPLANT
BNDG COMPR ESMARK 6X3 LF (GAUZE/BANDAGES/DRESSINGS) IMPLANT
BNDG ELASTIC 4INX 5YD STR LF (GAUZE/BANDAGES/DRESSINGS) ×2 IMPLANT
BNDG ELASTIC 6INX 5YD STR LF (GAUZE/BANDAGES/DRESSINGS) IMPLANT
BOOT WALKER SMALL (SOFTGOODS) IMPLANT
CANISTER SUCT 1200ML W/VALVE (MISCELLANEOUS) ×2 IMPLANT
CHLORAPREP W/TINT 26 (MISCELLANEOUS) ×2 IMPLANT
COVER BACK TABLE 60X90IN (DRAPES) ×2 IMPLANT
CUFF TRNQT CYL 34X4.125X (TOURNIQUET CUFF) ×2 IMPLANT
DRAPE EXTREMITY T 121X128X90 (DISPOSABLE) ×2 IMPLANT
DRAPE OEC MINIVIEW 54X84 (DRAPES) ×2 IMPLANT
DRAPE U-SHAPE 47X51 STRL (DRAPES) ×2 IMPLANT
DRSG MEPITEL 4X7.2 (GAUZE/BANDAGES/DRESSINGS) ×2 IMPLANT
ELECTRODE REM PT RTRN 9FT ADLT (ELECTROSURGICAL) ×2 IMPLANT
GAUZE PAD ABD 8X10 STRL (GAUZE/BANDAGES/DRESSINGS) ×4 IMPLANT
GAUZE SPONGE 4X4 12PLY STRL (GAUZE/BANDAGES/DRESSINGS) ×2 IMPLANT
GAUZE STRETCH 2X75IN STRL (MISCELLANEOUS) ×2 IMPLANT
GLOVE BIO SURGEON STRL SZ8 (GLOVE) ×2 IMPLANT
GLOVE BIOGEL PI IND STRL 7.0 (GLOVE) IMPLANT
GLOVE BIOGEL PI IND STRL 7.5 (GLOVE) IMPLANT
GLOVE BIOGEL PI IND STRL 8 (GLOVE) ×4 IMPLANT
GLOVE ECLIPSE 8.0 STRL XLNG CF (GLOVE) ×2 IMPLANT
GLOVE SURG SS PI 7.0 STRL IVOR (GLOVE) IMPLANT
GOWN STRL REUS W/ TWL LRG LVL3 (GOWN DISPOSABLE) ×2 IMPLANT
GOWN STRL REUS W/ TWL XL LVL3 (GOWN DISPOSABLE) ×4 IMPLANT
GUIDEWIRE SMOOTH 1.2X100 (WIRE) IMPLANT
NDL HYPO 22X1.5 SAFETY MO (MISCELLANEOUS) IMPLANT
PACK BASIN DAY SURGERY FS (CUSTOM PROCEDURE TRAY) ×2 IMPLANT
PAD CAST 4YDX4 CTTN HI CHSV (CAST SUPPLIES) ×2 IMPLANT
PADDING CAST ABS COTTON 4X4 ST (CAST SUPPLIES) IMPLANT
PADDING CAST COTTON 6X4 STRL (CAST SUPPLIES) IMPLANT
PENCIL SMOKE EVACUATOR (MISCELLANEOUS) ×2 IMPLANT
PLATE 5TH METATARSAL RT 6H (Plate) IMPLANT
PLATE SCREW NLOCK 2.5X11 (Screw) IMPLANT
SANITIZER HAND ALTRA PUMP 550 (MISCELLANEOUS) ×2 IMPLANT
SCREW BG LOCKING 2.5X10 (Screw) IMPLANT
SCREW BG NON-LOCKING 2.5X13 (Screw) IMPLANT
SCREW LOCKING 2.5X12 (Screw) IMPLANT
SHEET MEDIUM DRAPE 40X70 STRL (DRAPES) ×2 IMPLANT
SLEEVE SCD COMPRESS KNEE MED (STOCKING) ×2 IMPLANT
SOLN 0.9% NACL POUR BTL 1000ML (IV SOLUTION) ×2 IMPLANT
SPIKE FLUID TRANSFER (MISCELLANEOUS) IMPLANT
SPLINT PLASTER CAST FAST 5X30 (CAST SUPPLIES) IMPLANT
SPONGE T-LAP 18X18 ~~LOC~~+RFID (SPONGE) ×2 IMPLANT
STOCKINETTE 6 STRL (DRAPES) ×2 IMPLANT
SUCTION TUBE FRAZIER 10FR DISP (SUCTIONS) ×2 IMPLANT
SUT ETHILON 3 0 PS 1 (SUTURE) ×2 IMPLANT
SUT MNCRL AB 3-0 PS2 18 (SUTURE) ×2 IMPLANT
SUT MNCRL AB 4-0 PS2 18 (SUTURE) IMPLANT
SUT VIC AB 2-0 SH 27XBRD (SUTURE) IMPLANT
SUT VICRYL 0 SH 27 (SUTURE) IMPLANT
SUTURE FIBERWR #2 38 T-5 BLUE (SUTURE) IMPLANT
SYR BULB EAR ULCER 3OZ GRN STR (SYRINGE) ×2 IMPLANT
SYR CONTROL 10ML LL (SYRINGE) IMPLANT
TOWEL GREEN STERILE FF (TOWEL DISPOSABLE) ×4 IMPLANT
TUBE CONNECTING 20X1/4 (TUBING) ×2 IMPLANT
UNDERPAD 30X36 HEAVY ABSORB (UNDERPADS AND DIAPERS) ×2 IMPLANT
WIRE OLIVE SMOOTH 1.3 (WIRE) IMPLANT

## 2024-08-22 NOTE — Anesthesia Procedure Notes (Signed)
 Anesthesia Regional Block: Popliteal block   Pre-Anesthetic Checklist: , timeout performed,  Correct Patient, Correct Site, Correct Laterality,  Correct Procedure, Correct Position, site marked,  Risks and benefits discussed,  Surgical consent,  Pre-op evaluation,  At surgeon's request and post-op pain management  Laterality: Right  Prep: chloraprep       Needles:  Injection technique: Single-shot  Needle Type: Echogenic Needle     Needle Length: 9cm  Needle Gauge: 21     Additional Needles:   Procedures:,,,, ultrasound used (permanent image in chart),,    Narrative:  Start time: 08/22/2024 9:18 AM End time: 08/22/2024 9:25 AM Injection made incrementally with aspirations every 5 mL.  Performed by: Personally  Anesthesiologist: Corinne Garnette BRAVO, MD  Additional Notes: No pain on injection. No increased resistance to injection. Injection made in 5cc increments.  Good needle visualization.  Patient tolerated procedure well.

## 2024-08-22 NOTE — Op Note (Signed)
 ,08/22/2024  11:02 AM  PATIENT:  Deborah Petty  43 y.o. female  PRE-OPERATIVE DIAGNOSIS:  1.  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  Right peroneus longus tenosynovitis 3.  Right peroneus brevis tendon tear  POST-OPERATIVE DIAGNOSIS:  same  Procedure(s):  Right peroneus longus tenolysis Right peroneus brevis tendon repair Open treatment right 5th metatarsal base nonunion with internal fixation AP, lateral and oblique xrays of the right foot  SURGEON:  Norleen Armor, MD  ASSISTANT: Eva Barrack, PA-C  ANESTHESIA:   General, regional  EBL:  minimal   TOURNIQUET:   Total Tourniquet Time Documented: Thigh (Right) - 53 minutes Total: Thigh (Right) - 53 minutes  COMPLICATIONS:  None apparent  DISPOSITION:  Extubated, awake and stable to recovery.  INDICATION FOR PROCEDURE: 43 year old female without significant past medical history injured her right foot more than 6 months ago.  She sustained a fifth metatarsal base avulsion fracture.  She had persistent pain for many months.  An MRI indicates a nonunion at the fracture as well as a peroneus brevis tendon tear and peroneus longus tenosynovitis.  She has failed nonoperative treatment including activity and shoewear modification, oral inflammatories, immobilization and bracing.  She presents now for operative treatment of these painful and limiting right foot injuries.  The risks and benefits of the alternative treatment options have been discussed in detail.  The patient wishes to proceed with surgery and specifically understands risks of bleeding, infection, nerve damage, blood clots, need for additional surgery, amputation and death.   PROCEDURE IN DETAIL:  After pre operative consent was obtained, and the correct operative site was identified, the patient was brought to the operating room and placed supine on the OR table.  Anesthesia was administered.  Pre-operative antibiotics were  administered.  A surgical timeout was taken.  The right lower extremity was prepped and draped in standard sterile fashion with a tourniquet around the thigh.  The extremity was elevated, and the tourniquet was inflated to 250 mmHg.  An incision was made along the course of the peroneal tendons from the tip of the fibula to the fifth metatarsal.  Dissection was carried sharply down through the subcutaneous tissues.  Care was taken to protect branches of the sural nerve.  The peroneal tendon sheath was identified.  The peroneus brevis was inspected from the tip of the fibula to the fifth metatarsal base.  There was a longitudinal split tear noted along the lateral wall of the calcaneus.  The tear was debrided and then repaired with running 0 Vicryl.  The peroneus longus was then identified.  The tendon sheath was opened from the fibula to the cuboid tunnel.  Careful tenolysis was performed along the peroneus longus removing all tenosynovitis with scissors.  There was no evidence of tear of the peroneus longus.  An os peroneum was identified and appeared healthy with no evident degenerative changes at the articular cartilage.  The fifth metatarsal base was carefully inspected.  The fracture line was identified with the use of fluoroscopy.  A small drill bit was used to perforate along the fracture line freshening the bone surfaces.  The template for a Paragon 28 hole plate was positioned over the fracture line.  The 6 hole hook plate was selected.  It was positioned over the styloid at the base of the fifth metatarsal.  It was impacted into position.  Distally of the slotted hole was drilled.  A nonlocking 2.5 millimeter screw was inserted.  It was tightened securely compressing the fracture line.  The plate was then further secured to the bone with a 2.5 mm locking screw in the proximal fragment as well as another 2.5 mm locking screw distal to the fracture line and a final 2.5 mm nonlocking screw in the shaft of  the metatarsal.  AP, oblique and lateral radiographs confirmed appropriate position and length of all hardware.  The wound was irrigated copiously and sprinkled vancomycin powder.  Deep subcutaneous tissues were approximated with inverted simple sutures of 2-0 Vicryl.  The skin incision was closed with running 3-0 nylon.  Sterile dressings were applied followed by a compression wrap and a tall cam boot.  The tourniquet was released after application of the dressings.  The patient was awakened from anesthesia and transported to the recovery room in stable condition.   FOLLOW UP PLAN: Weightbearing as tolerated on the right lower extremity in the cam boot for transfers only.  Follow-up in 2 weeks for suture removal.  Plan 6 weeks postoperative weightbearing immobilization.  Aspirin for DVT prophylaxis after her upcoming hysterectomy.   RADIOGRAPHS: AP, oblique and lateral radiographs of the right foot were obtained intraoperatively.  These show interval open treatment of her fifth metatarsal base fracture with internal fixation.  Hardware is appropriately positioned in the appropriate lengths.  No other acute injuries are noted.    Justin Ollis PA-C was present and scrubbed for the duration of the operative case. His assistance was essential in positioning the patient, prepping and draping, gaining and maintaining exposure, performing the operation, closing and dressing the wounds and applying the splint.

## 2024-08-22 NOTE — Transfer of Care (Signed)
 Immediate Anesthesia Transfer of Care Note  Patient: Deborah Petty  Procedure(s) Performed: Open treatment right Fifth metatarsal base nonunion with internal fixation (Right: Foot) Peroneus brevis repair (Right: Ankle) Peroneus longus tenolysis (Right: Ankle)  Patient Location: PACU  Anesthesia Type:General  Level of Consciousness: sedated  Airway & Oxygen Therapy: Patient Spontanous Breathing and Patient connected to face mask oxygen  Post-op Assessment: Report given to RN and Post -op Vital signs reviewed and stable  Post vital signs: Reviewed and stable  Last Vitals:  Vitals Value Taken Time  BP 122/77 08/22/24 11:07  Temp    Pulse 85 08/22/24 11:09  Resp 17 08/22/24 11:09  SpO2 100 % 08/22/24 11:09  Vitals shown include unfiled device data.  Last Pain:  Vitals:   08/22/24 0853  TempSrc: Temporal  PainSc: 5       Patients Stated Pain Goal: 2 (08/22/24 0853)  Complications: No notable events documented.

## 2024-08-22 NOTE — Anesthesia Procedure Notes (Signed)
 Procedure Name: LMA Insertion Date/Time: 08/22/2024 9:54 AM  Performed by: Julieanne Fairy BROCKS, CRNAPre-anesthesia Checklist: Patient identified, Emergency Drugs available, Suction available and Patient being monitored Patient Re-evaluated:Patient Re-evaluated prior to induction Oxygen Delivery Method: Circle system utilized Preoxygenation: Pre-oxygenation with 100% oxygen Induction Type: IV induction Ventilation: Mask ventilation without difficulty LMA: LMA inserted LMA Size: 4.0 Number of attempts: 1 Airway Equipment and Method: Bite block Placement Confirmation: positive ETCO2 Tube secured with: Tape Dental Injury: Teeth and Oropharynx as per pre-operative assessment

## 2024-08-22 NOTE — Anesthesia Preprocedure Evaluation (Addendum)
 Anesthesia Evaluation  Patient identified by MRN, date of birth, ID band Patient awake    Reviewed: Allergy & Precautions, NPO status , Patient's Chart, lab work & pertinent test results, reviewed documented beta blocker date and time   Airway Mallampati: II  TM Distance: >3 FB Neck ROM: Full    Dental  (+) Teeth Intact, Dental Advisory Given   Pulmonary neg pulmonary ROS   Pulmonary exam normal breath sounds clear to auscultation       Cardiovascular hypertension, Pt. on home beta blockers + DVT  Normal cardiovascular exam+ dysrhythmias  Rhythm:Regular Rate:Normal     Neuro/Psych  Headaches PSYCHIATRIC DISORDERS Anxiety Depression     Neuromuscular disease    GI/Hepatic negative GI ROS, Neg liver ROS,,,  Endo/Other  Obesity   Renal/GU negative Renal ROS     Musculoskeletal negative musculoskeletal ROS (+)  Nondisplaced fracture of fifth metatarsal bone, right foot   Abdominal   Peds  Hematology negative hematology ROS (+)   Anesthesia Other Findings Day of surgery medications reviewed with the patient.  Reproductive/Obstetrics                              Anesthesia Physical Anesthesia Plan  ASA: 2  Anesthesia Plan: General   Post-op Pain Management: Regional block*, Tylenol  PO (pre-op)* and Toradol  IV (intra-op)*   Induction: Intravenous  PONV Risk Score and Plan: 3 and Midazolam , Dexamethasone  and Ondansetron   Airway Management Planned: LMA  Additional Equipment:   Intra-op Plan:   Post-operative Plan: Extubation in OR  Informed Consent: I have reviewed the patients History and Physical, chart, labs and discussed the procedure including the risks, benefits and alternatives for the proposed anesthesia with the patient or authorized representative who has indicated his/her understanding and acceptance.     Dental advisory given  Plan Discussed with: CRNA  Anesthesia  Plan Comments:         Anesthesia Quick Evaluation

## 2024-08-22 NOTE — H&P (Signed)
 Deborah Petty is an 43 y.o. female.   Chief Complaint: right foot pain HPI: 43 y/o female with h/o right 5th MT base avulsion fracture continues to have pain despite immobilization, time, PT and activity / shoewear modifications.  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  She has failed nonop treatment and presents today for surgery.  Past Medical History:  Diagnosis Date   Alopecia    Blepharospasm of left eye    Chronic migraine without aura, intractable, without status migrainosus    neurologist-- Dr Ilah  Duwaine Hailstone NP;  treated w/ botox  injections   Chronic right-sided low back pain 09/18/2018   Complication of anesthesia    c-section in 2015 spinal went too high   DDD (degenerative disc disease), cervical    Frequent PVCs 2022   cardiologist-  Dr MARLA. 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   followed by dermatologist--- palms, soles, axilla, genital area   Incomplete right bundle branch block (RBBB)    Insomnia    MDD (major depressive disorder)    Paresthesia of right foot 08/08/2018   Post endometrial ablation syndrome    Prolonged Q-T interval on ECG    Toe fracture, right 07/2023   closed, 5th digit  (08-19-2024  initial injury 10/ 2024)   Wears glasses     Past Surgical History:  Procedure Laterality Date   CESAREAN SECTION N/A 06/07/2016   Procedure: PRIMARY CESAREAN SECTION - BREECH PRESENTATION;  Surgeon: Charlie Flowers, MD;  Location: [facility] BIRTHING SU[facility];  Service: Obstetrics;  Laterality: N/A;  EDD 06/07/2016    CESAREAN SECTION N/A 05/17/2020   Procedure: Repeat CESAREAN SECTION;  Surgeon: Flowers Charlie, MD;  Location: [facility] LD ORS;  Service: Obstetrics;  Laterality: N/A;  EDD: 05/23/20  ;    AND  BILATERAL TUBAL LIGATION   DILITATION & CURRETTAGE/HYSTROSCOPY WITH NOVASURE ABLATION N/A 07/17/2023   Procedure: DILATATION & CURETTAGE/HYSTEROSCOPY WITH NOVASURE ABLATION;  Surgeon: Flowers Charlie, MD;  Location: [facility] ;  Service: Gynecology;  Laterality: N/A;   TONSILLECTOMY AND ADENOIDECTOMY  2010    Family History  Problem Relation Age of Onset   Hyperlipidemia Father    Hypertension Father    Diabetes Father    Thyroid  disease Father    COPD Father    Heart disease Father    Varicose Veins Father    Heart attack Maternal Grandmother    Diabetes Maternal Grandmother    Heart disease Paternal Grandfather    Diabetes Paternal Grandfather    Osteoarthritis Mother    Hypertension Mother    Hyperlipidemia Mother    Depression Mother    Social History:  reports that she has never smoked. She has never used smokeless tobacco. She reports that she does not currently use alcohol. She reports that she does not use drugs.  Allergies:  Allergies  Allergen Reactions   Rizatriptan  Palpitations    Chest tightness, SOB    No medications prior to admission.    Results for orders placed or performed during the hospital encounter  of 08/20/24 (from the past 48 hours)  Type and screen     Status: None   Collection Time: 08/20/24  9:13 AM  Result Value Ref Range   ABO/RH(D) A POS    Antibody Screen NEG    Sample Expiration 09/03/2024,2359    Extend sample reason      NO TRANSFUSIONS OR PREGNANCY IN THE PAST 3 MONTHS Performed at [facility], [address] [place], [place] [address]   CBC     Status: Abnormal   Collection Time: 08/20/24  9:19 AM  Result Value Ref Range   WBC 11.8 (H) 4.0 - 10.5 K/uL   RBC 4.79 3.87 - 5.11 MIL/uL   Hemoglobin 12.8 12.0 - 15.0 g/dL    HCT 60.1 63.9 - 53.9 %   MCV 83.1 80.0 - 100.0 fL   MCH 26.7 26.0 - 34.0 pg   MCHC 32.2 30.0 - 36.0 g/dL   RDW 86.8 88.4 - 84.4 %   Platelets 310 150 - 400 K/uL   nRBC 0.0 0.0 - 0.2 %    Comment: Performed at [facility], [address], [place], [place] [address]  Basic metabolic panel     Status: Abnormal   Collection Time: 08/20/24  9:19 AM  Result Value Ref Range   Sodium 138 135 - 145 mmol/L   Potassium 3.0 (L) 3.5 - 5.1 mmol/L   Chloride 102 98 - 111 mmol/L   CO2 23 22 - 32 mmol/L   Glucose, Bld 62 (L) 70 - 99 mg/dL    Comment: Glucose reference range applies only to samples taken after fasting for at least 8 hours.   BUN 5 (L) 6 - 20 mg/dL   Creatinine, Ser 9.07 0.44 - 1.00 mg/dL   Calcium 9.0 8.9 - 89.6 mg/dL   GFR, Estimated >39 >39 mL/min    Comment: (NOTE) Calculated using the CKD-EPI Creatinine Equation (2021)    Anion gap 13 5 - 15    Comment: Performed at [facility], [address] [place], [place] [address]   No results found.  Review of Systems  no recent f/c/n/v/ wt loss  Height 5' 6 (1.676 m), weight 88.5 kg, last menstrual period 07/30/2024, unknown if currently breastfeeding. Physical Exam  Wnwd woman in nad.  A and O.  EOMI.  Resp unlabored.  R foot ttp at the 5th MT base.  Skin healthy and intact.  No lymphadenopathy.  5/5 strength in eversion.  Pulses are palpable in the foot.  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 The risks and benefits of the alternative treatment options have been discussed in detail.  The patient wishes to proceed with surgery and specifically understands risks of bleeding, infection, nerve damage, blood clots, need for additional surgery, amputation and death.   Norleen Armor, MD 16-Sep-2024, 7:10 AM

## 2024-08-22 NOTE — Anesthesia Postprocedure Evaluation (Signed)
 Anesthesia Post Note  Patient: Catherine Ferrebee  Procedure(s) Performed: Open treatment right Fifth metatarsal base nonunion with internal fixation (Right: Foot) Peroneus brevis repair (Right: Ankle) Peroneus longus tenolysis (Right: Ankle)     Patient location during evaluation: PACU Anesthesia Type: General Level of consciousness: awake and alert Pain management: pain level controlled Vital Signs Assessment: post-procedure vital signs reviewed and stable Respiratory status: spontaneous breathing, nonlabored ventilation and respiratory function stable Cardiovascular status: blood pressure returned to baseline and stable Postop Assessment: no apparent nausea or vomiting Anesthetic complications: no   No notable events documented.  Last Vitals:  Vitals:   08/22/24 1130 08/22/24 1218  BP: 111/70 124/71  Pulse: 87 91  Resp: 18 20  Temp:    SpO2: 97% 97%    Last Pain:  Vitals:   08/22/24 1203  TempSrc:   PainSc: 0-No pain                 Garnette FORBES Skillern

## 2024-08-22 NOTE — Discharge Instructions (Addendum)
 Post Anesthesia Home Care Instructions  Activity: Get plenty of rest for the remainder of the day. A responsible individual must stay with you for 24 hours following the procedure.  For the next 24 hours, DO NOT: -Drive a car -Advertising copywriter -Drink alcoholic beverages -Take any medication unless instructed by your physician -Make any legal decisions or sign important papers.  Meals: Start with liquid foods such as gelatin or soup. Progress to regular foods as tolerated. Avoid greasy, spicy, heavy foods. If nausea and/or vomiting occur, drink only clear liquids until the nausea and/or vomiting subsides. Call your physician if vomiting continues.  Special Instructions/Symptoms: Your throat may feel dry or sore from the anesthesia or the breathing tube placed in your throat during surgery. If this causes discomfort, gargle with warm salt water. The discomfort should disappear within 24 hours.  If you had a scopolamine  patch placed behind your ear for the management of post- operative nausea and/or vomiting:  1. The medication in the patch is effective for 72 hours, after which it should be removed.  Wrap patch in a tissue and discard in the trash. Wash hands thoroughly with soap and water. 2. You may remove the patch earlier than 72 hours if you experience unpleasant side effects which may include dry mouth, dizziness or visual disturbances. 3. Avoid touching the patch. Wash your hands with soap and water after contact with the patch.    Regional Anesthesia Blocks  1. You may not be able to move or feel the blocked extremity after a regional anesthetic block. This may last may last from 3-48 hours after placement, but it will go away. The length of time depends on the medication injected and your individual response to the medication. As the nerves start to wake up, you may experience tingling as the movement and feeling returns to your extremity. If the numbness and inability to move  your extremity has not gone away after 48 hours, please call your surgeon.   2. The extremity that is blocked will need to be protected until the numbness is gone and the strength has returned. Because you cannot feel it, you will need to take extra care to avoid injury. Because it may be weak, you may have difficulty moving it or using it. You may not know what position it is in without looking at it while the block is in effect.  3. For blocks in the legs and feet, returning to weight bearing and walking needs to be done carefully. You will need to wait until the numbness is entirely gone and the strength has returned. You should be able to move your leg and foot normally before you try and bear weight or walk. You will need someone to be with you when you first try to ensure you do not fall and possibly risk injury.  4. Bruising and tenderness at the needle site are common side effects and will resolve in a few days.  5. Persistent numbness or new problems with movement should be communicated to the surgeon or the [facility] [phone] [facility] [phone]).  No Tylenol  before 3pm today.   Norleen Armor, MD [facility]  Please read the following information regarding your care after surgery.  Medications  You only need a prescription for the narcotic pain medicine (ex. oxycodone , Percocet, Norco).  All of the other medicines listed below are available over the counter. ? Aleve 2 pills twice a day for the first 3 days after  surgery. ? acetominophen (Tylenol ) 650 mg every 4-6 hours as you need for minor to moderate pain ? oxycodone  as prescribed for severe pain  Narcotic pain medicine (ex. oxycodone , Percocet, Vicodin) will cause constipation.  To prevent this problem, take the following medicines while you are taking any pain medicine. ? docusate sodium  (Colace) 100 mg twice a day ? senna (Senokot) 2 tablets twice a day   Weight Bearing ? Bear weight  only on your operated foot in the CAM boot.   Cast / Splint / Dressing ? Keep your splint, cast or dressing clean and dry.  Don't put anything (coat hanger, pencil, etc) down inside of it.  If it gets damp, use a hair dryer on the cool setting to dry it.  If it gets soaked, call the office to schedule an appointment for a cast change.   After your dressing, cast or splint is removed; you may shower, but do not soak or scrub the wound.  Allow the water to run over it, and then gently pat it dry.  Swelling It is normal for you to have swelling where you had surgery.  To reduce swelling and pain, keep your toes above your nose for at least 3 days after surgery.  It may be necessary to keep your foot or leg elevated for several weeks.  If it hurts, it should be elevated.  Follow Up Call my office at [phone] when you are discharged from the hospital or surgery center to schedule an appointment to be seen two weeks after surgery.  Call my office at [phone] if you develop a fever >101.5 F, nausea, vomiting, bleeding from the surgical site or severe pain.

## 2024-08-22 NOTE — Progress Notes (Signed)
Assisted Dr. Turk with right, popliteal, ultrasound guided block. Side rails up, monitors on throughout procedure. See vital signs in flow sheet. Tolerated Procedure well. 

## 2024-08-23 ENCOUNTER — Encounter (HOSPITAL_COMMUNITY): Payer: Self-pay | Admitting: Obstetrics & Gynecology

## 2024-08-23 ENCOUNTER — Encounter (HOSPITAL_COMMUNITY): Admitting: Certified Registered Nurse Anesthetist

## 2024-08-23 ENCOUNTER — Inpatient Hospital Stay (HOSPITAL_COMMUNITY): Admitting: Certified Registered Nurse Anesthetist

## 2024-08-23 ENCOUNTER — Other Ambulatory Visit: Payer: Self-pay

## 2024-08-23 ENCOUNTER — Observation Stay (HOSPITAL_COMMUNITY)
Admission: RE | Admit: 2024-08-23 | Discharge: 2024-08-24 | Disposition: A | Discharge status: Home / Self Care | Attending: Obstetrics & Gynecology | Admitting: Obstetrics & Gynecology

## 2024-08-23 ENCOUNTER — Encounter (HOSPITAL_COMMUNITY)
Admission: RE | Disposition: A | Discharge status: Home / Self Care | Payer: Self-pay | Source: Home / Self Care | Attending: Obstetrics & Gynecology

## 2024-08-23 DIAGNOSIS — N9985 Post endometrial ablation syndrome: Principal | ICD-10-CM | POA: Insufficient documentation

## 2024-08-23 DIAGNOSIS — N8003 Adenomyosis of the uterus: Secondary | ICD-10-CM | POA: Insufficient documentation

## 2024-08-23 DIAGNOSIS — Z7982 Long term (current) use of aspirin: Secondary | ICD-10-CM | POA: Diagnosis not present

## 2024-08-23 DIAGNOSIS — Z01818 Encounter for other preprocedural examination: Principal | ICD-10-CM

## 2024-08-23 DIAGNOSIS — G8918 Other acute postprocedural pain: Secondary | ICD-10-CM | POA: Diagnosis present

## 2024-08-23 HISTORY — DX: Chronic migraine without aura, intractable, without status migrainosus: G43.719

## 2024-08-23 HISTORY — DX: Presence of spectacles and contact lenses: Z97.3

## 2024-08-23 HISTORY — DX: Generalized anxiety disorder: F41.1

## 2024-08-23 HISTORY — DX: Unspecified right bundle-branch block: I45.10

## 2024-08-23 HISTORY — DX: Abnormal electrocardiogram (ECG) (EKG): R94.31

## 2024-08-23 HISTORY — PX: HYSTERECTOMY, TOTAL, LAPAROSCOPIC, ROBOT-ASSISTED WITH SALPINGECTOMY: SHX7587

## 2024-08-23 HISTORY — DX: Blepharospasm: G24.5

## 2024-08-23 HISTORY — DX: Major depressive disorder, single episode, unspecified: F32.9

## 2024-08-23 HISTORY — DX: Personal history of other venous thrombosis and embolism: Z86.718

## 2024-08-23 HISTORY — DX: Nonscarring hair loss, unspecified: L65.9

## 2024-08-23 HISTORY — DX: Post endometrial ablation syndrome: N99.85

## 2024-08-23 LAB — POCT PREGNANCY, URINE: Preg Test, Ur: NEGATIVE

## 2024-08-23 SURGERY — HYSTERECTOMY, TOTAL, LAPAROSCOPIC, ROBOT-ASSISTED WITH SALPINGECTOMY
Anesthesia: General | Site: Pelvis | Laterality: Bilateral

## 2024-08-23 MED ORDER — OXYCODONE HCL 5 MG/5ML PO SOLN: 5.0000 mg | Freq: Once | ORAL | Status: DC | PRN

## 2024-08-23 MED ORDER — IBUPROFEN 200 MG PO TABS: 600.0000 mg | ORAL_TABLET | Freq: Four times a day (QID) | ORAL | 0 refills | Status: AC | PRN

## 2024-08-23 MED ORDER — SUGAMMADEX SODIUM 200 MG/2ML IV SOLN
INTRAVENOUS | Status: DC | PRN
  Administered 2024-08-23: 200 mg via INTRAVENOUS

## 2024-08-23 MED ORDER — PHENYLEPHRINE HCL (PRESSORS) 10 MG/ML IV SOLN
INTRAVENOUS | Status: AC
  Filled 2024-08-23: qty 2

## 2024-08-23 MED ORDER — ACETAMINOPHEN 500 MG PO TABS
1000.0000 mg | ORAL_TABLET | Freq: Four times a day (QID) | ORAL | Status: DC
  Administered 2024-08-23 – 2024-08-24 (×4): 1000 mg via ORAL
  Filled 2024-08-23 (×3): qty 2

## 2024-08-23 MED ORDER — PHENYLEPHRINE HCL-NACL 20-0.9 MG/250ML-% IV SOLN
INTRAVENOUS | Status: DC | PRN
  Administered 2024-08-23: 20 ug/min via INTRAVENOUS

## 2024-08-23 MED ORDER — HEMOSTATIC AGENTS (NO CHARGE) OPTIME
TOPICAL | Status: DC | PRN
  Administered 2024-08-23: 1

## 2024-08-23 MED ORDER — LIDOCAINE 2% (20 MG/ML) 5 ML SYRINGE
INTRAMUSCULAR | Status: AC
  Filled 2024-08-23: qty 5

## 2024-08-23 MED ORDER — KETAMINE HCL 10 MG/ML IJ SOLN
INTRAMUSCULAR | Status: DC | PRN
  Administered 2024-08-23: 20 mg via INTRAVENOUS
  Administered 2024-08-23: 30 mg via INTRAVENOUS

## 2024-08-23 MED ORDER — MENTHOL 3 MG MT LOZG: 1.0000 | LOZENGE | OROMUCOSAL | Status: DC | PRN

## 2024-08-23 MED ORDER — ASPIRIN 81 MG PO TBEC
81.0000 mg | DELAYED_RELEASE_TABLET | Freq: Every day | ORAL | Status: AC
Start: 2024-08-25 — End: ?

## 2024-08-23 MED ORDER — METHOCARBAMOL 1000 MG/10ML IJ SOLN
INTRAMUSCULAR | Status: AC
  Filled 2024-08-23: qty 10

## 2024-08-23 MED ORDER — KETOROLAC TROMETHAMINE 30 MG/ML IJ SOLN
INTRAMUSCULAR | Status: AC
  Filled 2024-08-23: qty 1

## 2024-08-23 MED ORDER — FLECAINIDE ACETATE 100 MG PO TABS
100.0000 mg | ORAL_TABLET | Freq: Two times a day (BID) | ORAL | Status: DC
  Administered 2024-08-23 – 2024-08-24 (×2): 100 mg via ORAL
  Filled 2024-08-23 (×3): qty 1

## 2024-08-23 MED ORDER — OXYCODONE HCL 5 MG PO TABS: 5.0000 mg | ORAL_TABLET | Freq: Once | ORAL | Status: DC | PRN

## 2024-08-23 MED ORDER — POTASSIUM CHLORIDE 10 MEQ/100ML IV SOLN
INTRAVENOUS | Status: DC | PRN
  Administered 2024-08-23 (×2): 10 meq via INTRAVENOUS

## 2024-08-23 MED ORDER — SODIUM CHLORIDE 0.9 % IR SOLN
Status: DC | PRN
  Administered 2024-08-23: 1000 mL

## 2024-08-23 MED ORDER — CEFAZOLIN SODIUM-DEXTROSE 2-4 GM/100ML-% IV SOLN
INTRAVENOUS | Status: AC
  Filled 2024-08-23: qty 100

## 2024-08-23 MED ORDER — ONDANSETRON HCL 4 MG/2ML IJ SOLN
INTRAMUSCULAR | Status: AC
  Filled 2024-08-23: qty 2

## 2024-08-23 MED ORDER — FENTANYL CITRATE (PF) 100 MCG/2ML IJ SOLN
INTRAMUSCULAR | Status: AC
  Filled 2024-08-23: qty 2

## 2024-08-23 MED ORDER — OXYCODONE HCL 5 MG PO TABS: 5.0000 mg | ORAL_TABLET | Freq: Four times a day (QID) | ORAL | 0 refills | Status: AC | PRN

## 2024-08-23 MED ORDER — LACTATED RINGERS IV SOLN: INTRAVENOUS | Status: DC

## 2024-08-23 MED ORDER — HYDROMORPHONE HCL 1 MG/ML IJ SOLN
0.5000 mg | INTRAMUSCULAR | Status: DC | PRN
  Administered 2024-08-23 – 2024-08-24 (×3): 0.5 mg via INTRAVENOUS
  Filled 2024-08-23 (×2): qty 0.5

## 2024-08-23 MED ORDER — ONDANSETRON HCL 4 MG/2ML IJ SOLN: 4.0000 mg | Freq: Four times a day (QID) | INTRAMUSCULAR | Status: DC | PRN

## 2024-08-23 MED ORDER — CHLORHEXIDINE GLUCONATE 0.12 % MT SOLN
15.0000 mL | Freq: Once | OROMUCOSAL | Status: AC
  Administered 2024-08-23: 15 mL via OROMUCOSAL

## 2024-08-23 MED ORDER — PROPOFOL 10 MG/ML IV BOLUS
INTRAVENOUS | Status: DC | PRN
  Administered 2024-08-23: 120 mg via INTRAVENOUS

## 2024-08-23 MED ORDER — PROPOFOL 500 MG/50ML IV EMUL
INTRAVENOUS | Status: DC | PRN
  Administered 2024-08-23: 115 ug/kg/min via INTRAVENOUS
  Administered 2024-08-23: 150 ug/kg/min via INTRAVENOUS

## 2024-08-23 MED ORDER — HYDROMORPHONE HCL 1 MG/ML IJ SOLN
INTRAMUSCULAR | Status: AC
  Filled 2024-08-23: qty 1

## 2024-08-23 MED ORDER — FENTANYL CITRATE (PF) 250 MCG/5ML IJ SOLN
INTRAMUSCULAR | Status: DC | PRN
  Administered 2024-08-23: 100 ug via INTRAVENOUS

## 2024-08-23 MED ORDER — BUSPIRONE HCL 15 MG PO TABS
30.0000 mg | ORAL_TABLET | Freq: Two times a day (BID) | ORAL | Status: DC
  Administered 2024-08-23 – 2024-08-24 (×2): 30 mg via ORAL
  Filled 2024-08-23 (×3): qty 2

## 2024-08-23 MED ORDER — ROCURONIUM BROMIDE 10 MG/ML (PF) SYRINGE
PREFILLED_SYRINGE | INTRAVENOUS | Status: DC | PRN
  Administered 2024-08-23 (×2): 20 mg via INTRAVENOUS
  Administered 2024-08-23: 30 mg via INTRAVENOUS
  Administered 2024-08-23: 50 mg via INTRAVENOUS

## 2024-08-23 MED ORDER — FENTANYL CITRATE (PF) 100 MCG/2ML IJ SOLN
25.0000 ug | INTRAMUSCULAR | Status: DC | PRN
  Administered 2024-08-23: 50 ug via INTRAVENOUS
  Administered 2024-08-23 (×2): 25 ug via INTRAVENOUS

## 2024-08-23 MED ORDER — DIPHENHYDRAMINE HCL 50 MG/ML IJ SOLN
INTRAMUSCULAR | Status: DC | PRN
  Administered 2024-08-23: 12.5 mg via INTRAVENOUS

## 2024-08-23 MED ORDER — ACETAMINOPHEN 500 MG PO TABS
ORAL_TABLET | ORAL | Status: AC
  Filled 2024-08-23: qty 2

## 2024-08-23 MED ORDER — ROCURONIUM BROMIDE 10 MG/ML (PF) SYRINGE
PREFILLED_SYRINGE | INTRAVENOUS | Status: AC
  Filled 2024-08-23: qty 10

## 2024-08-23 MED ORDER — ORAL CARE MOUTH RINSE: 15.0000 mL | Freq: Once | OROMUCOSAL | Status: AC

## 2024-08-23 MED ORDER — LIDOCAINE 2% (20 MG/ML) 5 ML SYRINGE
INTRAMUSCULAR | Status: DC | PRN
  Administered 2024-08-23: 60 mg via INTRAVENOUS

## 2024-08-23 MED ORDER — POVIDONE-IODINE 10 % EX SWAB
2.0000 | Freq: Once | CUTANEOUS | Status: AC
  Administered 2024-08-23: 2 via TOPICAL

## 2024-08-23 MED ORDER — ROPIVACAINE HCL 5 MG/ML IJ SOLN
INTRAMUSCULAR | Status: AC
  Filled 2024-08-23: qty 30

## 2024-08-23 MED ORDER — HYDROMORPHONE HCL 1 MG/ML IJ SOLN
0.2500 mg | INTRAMUSCULAR | Status: DC | PRN
  Administered 2024-08-23 (×4): 0.5 mg via INTRAVENOUS

## 2024-08-23 MED ORDER — ACETAMINOPHEN 500 MG PO TABS: 500.0000 mg | ORAL_TABLET | Freq: Four times a day (QID) | ORAL | 0 refills | Status: AC | PRN

## 2024-08-23 MED ORDER — ACETAMINOPHEN 500 MG PO TABS
ORAL_TABLET | ORAL | Status: AC
  Filled 2024-08-23: qty 1

## 2024-08-23 MED ORDER — CHLORHEXIDINE GLUCONATE 0.12 % MT SOLN
OROMUCOSAL | Status: AC
Start: 2024-08-23 — End: 2024-08-23
  Filled 2024-08-23: qty 15

## 2024-08-23 MED ORDER — MIDAZOLAM HCL (PF) 2 MG/2ML IJ SOLN
INTRAMUSCULAR | Status: DC | PRN
  Administered 2024-08-23: 2 mg via INTRAVENOUS

## 2024-08-23 MED ORDER — SODIUM CHLORIDE (PF) 0.9 % IJ SOLN
INTRAMUSCULAR | Status: AC
  Filled 2024-08-23: qty 50

## 2024-08-23 MED ORDER — KETOROLAC TROMETHAMINE 30 MG/ML IJ SOLN
30.0000 mg | Freq: Four times a day (QID) | INTRAMUSCULAR | Status: DC
  Administered 2024-08-23 – 2024-08-24 (×2): 30 mg via INTRAVENOUS
  Filled 2024-08-23 (×3): qty 1

## 2024-08-23 MED ORDER — KETAMINE HCL 50 MG/5ML IJ SOSY
PREFILLED_SYRINGE | INTRAMUSCULAR | Status: AC
  Filled 2024-08-23: qty 5

## 2024-08-23 MED ORDER — ONDANSETRON HCL 4 MG PO TABS: 4.0000 mg | ORAL_TABLET | Freq: Four times a day (QID) | ORAL | Status: DC | PRN

## 2024-08-23 MED ORDER — DEXAMETHASONE SOD PHOSPHATE PF 10 MG/ML IJ SOLN
INTRAMUSCULAR | Status: DC | PRN
Start: 2024-08-23 — End: 2024-08-23
  Administered 2024-08-23: 10 mg via INTRAVENOUS

## 2024-08-23 MED ORDER — KETOROLAC TROMETHAMINE 30 MG/ML IJ SOLN
INTRAMUSCULAR | Status: DC | PRN
  Administered 2024-08-23: 30 mg via INTRAVENOUS

## 2024-08-23 MED ORDER — METHOCARBAMOL 1000 MG/10ML IJ SOLN
500.0000 mg | Freq: Once | INTRAMUSCULAR | Status: AC
  Administered 2024-08-23: 500 mg via INTRAVENOUS

## 2024-08-23 MED ORDER — CEFAZOLIN SODIUM-DEXTROSE 2-4 GM/100ML-% IV SOLN
2.0000 g | INTRAVENOUS | Status: AC
  Administered 2024-08-23: 2 g via INTRAVENOUS

## 2024-08-23 MED ORDER — 0.9 % SODIUM CHLORIDE (POUR BTL) OPTIME
TOPICAL | Status: DC | PRN
  Administered 2024-08-23: 1000 mL

## 2024-08-23 MED ORDER — MIDAZOLAM HCL 2 MG/2ML IJ SOLN
INTRAMUSCULAR | Status: AC
  Filled 2024-08-23: qty 2

## 2024-08-23 SURGICAL SUPPLY — 52 items
BARRIER ADHS 3X4 INTERCEED (GAUZE/BANDAGES/DRESSINGS) IMPLANT
CATH FOLEY 3WAY 5CC 16FR (CATHETERS) IMPLANT
COVER BACK TABLE 60X90IN (DRAPES) ×1 IMPLANT
COVER TIP SHEARS 8 DVNC (MISCELLANEOUS) ×1 IMPLANT
DEFOGGER SCOPE WARM SEASHARP (MISCELLANEOUS) ×1 IMPLANT
DERMABOND ADVANCED .7 DNX12 (GAUZE/BANDAGES/DRESSINGS) ×1 IMPLANT
DILATOR CANAL MILEX (MISCELLANEOUS) IMPLANT
DRAPE ARM DVNC X/XI (DISPOSABLE) ×4 IMPLANT
DRAPE COLUMN DVNC XI (DISPOSABLE) ×1 IMPLANT
DRAPE SURG IRRIG POUCH 19X23 (DRAPES) ×1 IMPLANT
DRAPE UTILITY XL STRL (DRAPES) ×1 IMPLANT
DRIVER NDL MEGA SUTCUT DVNCXI (INSTRUMENTS) ×1 IMPLANT
DRIVER NDLE MEGA SUTCUT DVNCXI (INSTRUMENTS) ×1 IMPLANT
DURAPREP 26ML APPLICATOR (WOUND CARE) ×1 IMPLANT
ELECTRODE REM PT RTRN 9FT ADLT (ELECTROSURGICAL) ×1 IMPLANT
FORCEPS BPLR 8 MD DVNC XI (FORCEP) ×1 IMPLANT
FORCEPS BPLR FENES DVNC XI (FORCEP) IMPLANT
FORCEPS BPLR LNG DVNC XI (INSTRUMENTS) ×1 IMPLANT
FORCEPS LONG TIP 8 DVNC XI (FORCEP) IMPLANT
FORCEPS PROGRASP DVNC XI (FORCEP) ×1 IMPLANT
GAUZE 4X4 16PLY ~~LOC~~+RFID DBL (SPONGE) ×1 IMPLANT
GAUZE PETROLATUM 1 X8 (GAUZE/BANDAGES/DRESSINGS) IMPLANT
GLOVE BIO SURGEON STRL SZ7 (GLOVE) ×3 IMPLANT
GLOVE BIOGEL PI IND STRL 7.0 (GLOVE) ×5 IMPLANT
IRRIGATION STRYKERFLOW (MISCELLANEOUS) ×1 IMPLANT
KIT PINK PAD W/HEAD ARM REST (MISCELLANEOUS) ×2 IMPLANT
LEGGING LITHOTOMY PAIR STRL (DRAPES) ×1 IMPLANT
OBTURATOR OPTICALSTD 8 DVNC (TROCAR) ×1 IMPLANT
OCCLUDER COLPOPNEUMO (BALLOONS) ×1 IMPLANT
PACK ROBOT WH (CUSTOM PROCEDURE TRAY) ×1 IMPLANT
PACK ROBOTIC GOWN (GOWN DISPOSABLE) ×1 IMPLANT
PAD OB MATERNITY 11 LF (PERSONAL CARE ITEMS) ×1 IMPLANT
POWDER SURGICEL 3.0 GRAM (HEMOSTASIS) IMPLANT
SCISSORS MNPLR CVD DVNC XI (INSTRUMENTS) ×1 IMPLANT
SEAL UNIV 5-12 XI (MISCELLANEOUS) ×3 IMPLANT
SEALER VESSEL EXT DVNC XI (MISCELLANEOUS) ×1 IMPLANT
SET IRRIG Y-TYPE CYSTO (SET/KITS/TRAYS/PACK) IMPLANT
SET TRI-LUMEN FLTR TB AIRSEAL (TUBING) ×1 IMPLANT
SOLN STERILE WATER BTL 1000 ML (IV SOLUTION) ×1 IMPLANT
SPIKE FLUID TRANSFER (MISCELLANEOUS) ×2 IMPLANT
SUT VIC AB 0 CT1 27XBRD ANBCTR (SUTURE) IMPLANT
SUT VIC AB 4-0 PS2 18 (SUTURE) ×2 IMPLANT
SUT VICRYL 0 UR6 27IN ABS (SUTURE) ×1 IMPLANT
SUT VLOC 180 0 9IN GS21 (SUTURE) ×1 IMPLANT
TIP ENDOSCOPIC SURGICEL (TIP) IMPLANT
TIP UTERINE 5.1X6CM LAV DISP (MISCELLANEOUS) IMPLANT
TIP UTERINE 6.7X10CM GRN DISP (MISCELLANEOUS) IMPLANT
TIP UTERINE 6.7X8CM BLUE DISP (MISCELLANEOUS) IMPLANT
TOWEL GREEN STERILE (TOWEL DISPOSABLE) ×1 IMPLANT
TRAY FOL W/BAG SLVR 16FR STRL (SET/KITS/TRAYS/PACK) IMPLANT
TROCAR PORT AIRSEAL 5X120 (TROCAR) ×1 IMPLANT
UNDERPAD 30X36 HEAVY ABSORB (UNDERPADS AND DIAPERS) ×1 IMPLANT

## 2024-08-23 NOTE — Anesthesia Procedure Notes (Signed)
 Procedure Name: Intubation Date/Time: 08/23/2024 1:21 PM  Performed by: Erlene Powell POUR, CRNAPre-anesthesia Checklist: Patient identified, Emergency Drugs available, Suction available and Patient being monitored Patient Re-evaluated:Patient Re-evaluated prior to induction Oxygen Delivery Method: Circle system utilized Preoxygenation: Pre-oxygenation with 100% oxygen Induction Type: IV induction Ventilation: Mask ventilation without difficulty Laryngoscope Size: Miller and 2 Grade View: Grade I Tube type: Oral Tube size: 7.0 mm Number of attempts: 1 Airway Equipment and Method: Stylet Placement Confirmation: ETT inserted through vocal cords under direct vision, positive ETCO2 and breath sounds checked- equal and bilateral Secured at: 22 cm Tube secured with: Tape Dental Injury: Teeth and Oropharynx as per pre-operative assessment

## 2024-08-23 NOTE — Anesthesia Preprocedure Evaluation (Signed)
 Anesthesia Evaluation  Patient identified by MRN, date of birth, ID band Patient awake    Reviewed: Allergy & Precautions, H&P , NPO status , Patient's Chart, lab work & pertinent test results  Airway Mallampati: II   Neck ROM: full    Dental   Pulmonary neg pulmonary ROS   breath sounds clear to auscultation       Cardiovascular negative cardio ROS  Rhythm:regular Rate:Normal     Neuro/Psych  Headaches PSYCHIATRIC DISORDERS Anxiety Depression       GI/Hepatic   Endo/Other    Renal/GU      Musculoskeletal   Abdominal   Peds  Hematology   Anesthesia Other Findings   Reproductive/Obstetrics                             Anesthesia Physical Anesthesia Plan  ASA: 2  Anesthesia Plan: General   Post-op Pain Management:    Induction: Intravenous  PONV Risk Score and Plan: 3 and Ondansetron, Dexamethasone, Midazolam and Treatment may vary due to age or medical condition  Airway Management Planned: Oral ETT  Additional Equipment:   Intra-op Plan:   Post-operative Plan: Extubation in OR  Informed Consent: I have reviewed the patients History and Physical, chart, labs and discussed the procedure including the risks, benefits and alternatives for the proposed anesthesia with the patient or authorized representative who has indicated his/her understanding and acceptance.     Dental advisory given  Plan Discussed with: CRNA, Anesthesiologist and Surgeon  Anesthesia Plan Comments:        Anesthesia Quick Evaluation

## 2024-08-23 NOTE — Progress Notes (Signed)
 Patient ID: Deborah Petty, female   DOB: 02-12-1981, 43 y.o.   MRN: [id number] RN Delon called from PACU.SABRA Pain not well controlled, will admit overnight Orders placed  Dilaudid  IV for pain mmgt

## 2024-08-23 NOTE — Transfer of Care (Signed)
 Immediate Anesthesia Transfer of Care Note  Patient: Deborah Petty  Procedure(s) Performed: HYSTERECTOMY, TOTAL, LAPAROSCOPIC, ROBOT-ASSISTED WITH SALPINGECTOMY (Bilateral)  Patient Location: PACU  Anesthesia Type:General  Level of Consciousness: drowsy  Airway & Oxygen Therapy: Patient Spontanous Breathing and Patient connected to face mask oxygen  Post-op Assessment: Report given to RN and Post -op Vital signs reviewed and stable  Post vital signs: Reviewed and stable  Last Vitals:  Vitals Value Taken Time  BP 107/53 08/23/24 15:39  Temp    Pulse 71 08/23/24 15:43  Resp 30 08/23/24 15:43  SpO2 100 % 08/23/24 15:43  Vitals shown include unfiled device data.  Last Pain:  Vitals:   08/23/24 1111  TempSrc: Oral  PainSc: 7       Patients Stated Pain Goal: 2 (08/23/24 1111)  Complications: No notable events documented.

## 2024-08-23 NOTE — Progress Notes (Signed)
 Pt states she still is having abdominal pain.  Pt states that Dr Barbette told her she would be admitted if pain uncontrolled.  No orders written.  On call MD called to confirm whether to admit pt or not.

## 2024-08-23 NOTE — Anesthesia Postprocedure Evaluation (Signed)
 Anesthesia Post Note  Patient: Deborah Petty  Procedure(s) Performed: HYSTERECTOMY, TOTAL, LAPAROSCOPIC, ROBOT-ASSISTED WITH SALPINGECTOMY (Bilateral: Pelvis)     Patient location during evaluation: PACU Anesthesia Type: General Level of consciousness: awake and alert and oriented Pain management: pain level controlled Vital Signs Assessment: post-procedure vital signs reviewed and stable Respiratory status: spontaneous breathing, nonlabored ventilation and respiratory function stable Cardiovascular status: blood pressure returned to baseline and stable Postop Assessment: no apparent nausea or vomiting Anesthetic complications: no   No notable events documented.  Last Vitals:  Vitals:   08/23/24 1608 08/23/24 1715  BP:    Pulse: 75 78  Resp: (!) 22 13  Temp:    SpO2: 97% 92%    Last Pain:  Vitals:   08/23/24 1710  TempSrc:   PainSc: 6    Pain Goal: Patients Stated Pain Goal: 2 (08/23/24 1111)                 Bobie Caris A.

## 2024-08-23 NOTE — Op Note (Signed)
 08/23/2024 Deborah Petty    Preoperative diagnosis:  Menorrhagia, pain, post uterine ablation syndrome Postop diagnosis: Same Procedure: da Vinci robot assisted total laparoscopic hysterectomy and bilateral salpingectomy Anesthesia Gen. Endotracheal Surgeon: Dr. Robbi Render Assistant: RNFA Daine Abbot IV fluids: 700 cc LR and Potassium replacement EBL: less than 50 cc Urine output: 290 cc, clear, foley removed before extubation Complications: none Pathology: Uterus with cervix and both remaining fimbrial parts of fallopian tubes Disposition: PACU, stable Findings: Uterus with bladder adhesions and increased vascularity. Normal ovaries. Both ureteral peristalsis before and at the end of surgery.   Procedure:  Risks complications of surgery including infection, bleeding, damage to internal organs and other surgery related problems including pneumonia, VTE reviewed and informed written consent was obtained.  Patient was brought to the operating room with IV running. She received 2 gm Ancef  . Underwent general anesthesia without difficulty and was given dorsal lithotomy position, prepped and draped in sterile fashion. Foley catheter was placed. Cervix was exposed with a speculum and anterior lip of the cervix was grasped with tenaculum. 0- vicryl stay suture placed. Uterus was difficult to sound due to intra-uterine adhesion from ablation.  I tried #6 tip, it would not inflate. So I switched to # 8 tip on Rumi and likely pushed through weak uterine wall as balloon inflated easily, so perforation was suspected and confirmed at laparoscopy. Small Koh ring was used on Zumi.   I was advised not use any local anesthetic as patient had received Exparel  in her foot yesterday.  Attention was focused on abdomen. Supra umbilical 10 mm vertical incision made with scalpel, fascia grasped with Kocher's and incised, posterior rectus sheath and peritoneum grasped, incised, intraabdominal entry confirmed.  Purse string stay stitch on 0-Vicryl taken on fascia and Robot cannula with reducer introduced. Pneumoperitoneum was begun. Laparoscope was introduced and the peritoneal cavity was evaluated. Uterine perforation noted with tip balloon through fundal posterior part, no active bleeding.  Trendelenburg position given.    Port sited marked. One robotic cannula inserted on right side and one on the left side under vision. And one assist Airflowl on left after marking sites. Robot was docked from left.  Arm 1 was tucked away. Arm 2 had Vessel sealer, arm 3 camera and arm 4 had Fenestrated grasper with bipolar energy.    Dr. Render scrubbed out and went for surgical console.  Uterus, ovaries, tubes and ureters evaluated. Both fallopian tubes had only fimbrial portions remaining. Uterus was deviated to the patient's right. The left fallopian tube excised and passed off.  Then right fallopian tube excised and passed off. Left ovary grasped, left utero-ovarian ligament desiccated and cut ligament was desiccated and cut, followed by broad ligament and then the left Round ligament which was desiccated and cut. Anterior bladder broad ligament was opened and incised. Posterior broad ligament incised up to the uterosacral ligament and left uterine vessels skeletonized. Lot of collateral blood flow noted in broad ligament. Uterus was deviated to the left and right ovary grasped and utero-ovarian ligament desiccated and cut followed by broad ligament and right Round ligament. Anterior broad ligament was incised to create bladder flap.  Bladder was adherent to cervix. Bladder back filled and its super edge assessed and dissection continued mostly blunt and some sharp and bladder was pushed away until [facility] ring impression at cervicovaginal junction was seen well anteriorly. Right posterior broad ligament dissected, right Uterine vessels skeletonized. Right uterine vessels were desiccated and cut. Uterus was deviated to the right  and the left uterine vessels were desiccated and cut. Vaginal occluder was inflated. Colpotomy was begun starting from midline anteriorly coming to the left and right and then circumferentially staying above the uterosacral ligaments posteriorly. Uterus, cervix were pulled out of the vaginal opening and vaginal occluder balloon placed to maintain pneumoperitoneum.  Vaginal cut edges were evaluated for hemostasis and some cuff bleeding cauterized. Cuff closure done using 0-V lock in two layers, starting on right, ending at left and returning to right with good hemostasis.  Irrigation was performed, all pedicles appeared to be hemostatic.  Surgicel sprayed in bladder dissection area and washed after 1 minute per protocol and hemostasis noted.   Robotic instruments were removed. Robot was undocked. Patient was made supine. Lap'scope was reintroduced, hemostasis was excellent. All cannulas were removed under vision. Stay sutures at the fascia tied together and additional one stitch placed to cover a slight gap with excellent fascial closure. Skin approximated with subcuticular stitches on 4-0 Vicryl. Dermabond was applied. No vaginal bleeding noted on vaginal exam at the end. Foley removed.  All instruments/lap/sponges counts were correct x2.  No complications. Patient tolerated procedure well and was reversed from anesthesia and brought to the PACU stable condition.    Dr Barbette was the surgeon for entire case.   Robbi Barbette MD

## 2024-08-23 NOTE — H&P (Signed)
 Deborah Petty is an 43 y.o. female. 43 yo was seen in office for severer cramping w/ spotting many days and heavy 2 day menses S/p Novasure ablation in Oct'24  it helped for 6 months. Now more crampy, 2 heavy days and 6 light days and spots lot. Cramps are worse than before, cannot sleep.  IUD before kids didnt work, caused cysts. Estrogen causing blood clots and migraines. Took POP after kid, didnt help.  UE DVT at IV site after 2nd kid. Full work uo w/ [employer] for DVT was neg.   Had foot surgery on 11/20. 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 EMS thin 2.9 mm. no hematometra noted, ovaries normal.   Prior to ablation- endometrial bx benign. Done having kids before ablation done G2P2,  C/s x 2  Nl Pap hx, 2024 HPV neg Nl MMG  RBBB, cardio clearance done   Patient's last menstrual period was 07/30/2024 (approximate).    Past Medical History:  Diagnosis Date   Alopecia    Blepharospasm of left eye    Chronic migraine without aura, intractable, without status migrainosus    neurologist-- Dr Ilah  Duwaine Hailstone NP;  treated w/ botox  injections   Chronic right-sided low back pain 09/18/2018   Complication of anesthesia    c-section in 2015 spinal went too high   DDD (degenerative disc disease), cervical    Frequent PVCs 2022   cardiologist-  Dr MARLA. 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   followed by  dermatologist--- palms, soles, axilla, genital area   Incomplete right bundle branch block (RBBB)    Insomnia    MDD (major depressive disorder)    Paresthesia of right foot 08/08/2018   Post endometrial ablation syndrome    Prolonged Q-T interval on ECG    Toe fracture, right 07/2023   closed, 5th digit  (08-19-2024  initial injury 10/ 2024)   Wears glasses     Past Surgical History:  Procedure Laterality Date   CESAREAN SECTION N/A 06/07/2016   Procedure: PRIMARY CESAREAN SECTION - BREECH PRESENTATION;  Surgeon: Charlie Flowers, MD;  [facility];  Service: Obstetrics;  Laterality: N/A;  EDD 06/07/2016   CESAREAN SECTION N/A 05/17/2020   Procedure: Repeat CESAREAN SECTION;  Surgeon: Flowers Charlie, MD;  Location: [facility];  Service: Obstetrics;  Laterality: N/A;  EDD: 05/23/20  ;    AND  BILATERAL TUBAL LIGATION   DILITATION & CURRETTAGE/HYSTROSCOPY WITH NOVASURE ABLATION N/A 07/17/2023   Procedure: DILATATION & CURETTAGE/HYSTEROSCOPY WITH NOVASURE ABLATION;  Surgeon: Flowers Charlie, MD;  Location: [facility] [place];  Service: Gynecology;  Laterality: N/A;   TONSILLECTOMY AND ADENOIDECTOMY  2010    Family History  Problem Relation Age of Onset   Hyperlipidemia Father    Hypertension Father    Diabetes Father    Thyroid  disease Father    COPD Father    Heart  disease Father    Varicose Veins Father    Heart attack Maternal Grandmother    Diabetes Maternal Grandmother    Heart disease Paternal Grandfather    Diabetes Paternal Grandfather    Osteoarthritis Mother    Hypertension Mother    Hyperlipidemia Mother    Depression Mother     Social History:  reports that she has never smoked. She has never used smokeless tobacco. She reports that she does not currently use alcohol. She reports that she does not use drugs.  Allergies:  Allergies  Allergen Reactions   Rizatriptan  Palpitations    Chest tightness, SOB    No medications prior to admission.     Review of Systems  neg except recent foot surgery 11/20  Height 5' 6 (1.676 m), weight 88.5 kg, last menstrual period 07/30/2024, unknown if currently breastfeeding. Physical Exam BP 118/80   Pulse 72   Temp 98.5 F (36.9 C) (Oral)   Resp 17   Ht 5' 6 (1.676 m)   Wt 89 kg   LMP 07/30/2024 (Approximate) Comment: negative UPT  SpO2 99%   BMI 31.67 kg/m   A&O x 3, no acute distress. Pleasant HEENT neg, no thyromegaly Lungs CTA bilat CV RRR, S1S2 normal Abdo soft, non tender, non acute Extr no edema/ tenderness Pelvic der   see sono report    Results for orders placed or performed during the hospital encounter of 08/22/24 (from the past 24 hours)  Pregnancy, urine POC     Status: None   Collection Time: 08/22/24  8:41 AM  Result Value Ref Range   Preg Test, Ur NEGATIVE NEGATIVE    DG MINI C-ARM IMAGE ONLY Result Date: 08/22/2024 There is no interpretation for this exam.  This order is for images obtained during a surgical procedure.  Please See Surgeries Tab for more information regarding the procedure.    Assessment/Plan: 43 yo w/ failed endometrial ablation, here for hysterectomy and bilateral salpingectomy  Risks/complications of surgery reviewed incl infection, bleeding, damage to internal organs including bladder, bowels, ureters, blood vessels, other risks from anesthesia, VTE and delayed complications of any surgery, complications in future surgery reviewed.    Lev Cervone R Calton [place] 08/23/2024

## 2024-08-24 ENCOUNTER — Encounter (HOSPITAL_COMMUNITY): Payer: Self-pay | Admitting: Obstetrics & Gynecology

## 2024-08-24 DIAGNOSIS — N9985 Post endometrial ablation syndrome: Secondary | ICD-10-CM | POA: Diagnosis not present

## 2024-08-24 LAB — BASIC METABOLIC PANEL WITH GFR
Anion gap: 11 (ref 5–15)
BUN: 12 mg/dL (ref 6–20)
CO2: 25 mmol/L (ref 22–32)
Calcium: 8.8 mg/dL — ABNORMAL LOW (ref 8.9–10.3)
Chloride: 102 mmol/L (ref 98–111)
Creatinine, Ser: 0.88 mg/dL (ref 0.44–1.00)
GFR, Estimated: >60 mL/min (ref >60)
Glucose, Bld: 83 mg/dL (ref 70–99)
Potassium: 4.3 mmol/L (ref 3.5–5.1)
Sodium: 138 mmol/L (ref 135–145)

## 2024-08-24 LAB — CBC
HCT: 33.4 % — ABNORMAL LOW (ref 36.0–46.0)
Hemoglobin: 10.7 g/dL — ABNORMAL LOW (ref 12.0–15.0)
MCH: 26.4 pg (ref 26.0–34.0)
MCHC: 32 g/dL (ref 30.0–36.0)
MCV: 82.5 fL (ref 80.0–100.0)
Platelets: 281 K/uL (ref 150–400)
RBC: 4.05 MIL/uL (ref 3.87–5.11)
RDW: 13.4 % (ref 11.5–15.5)
WBC: 14.6 K/uL — ABNORMAL HIGH (ref 4.0–10.5)
nRBC: 0 % (ref 0.0–0.2)

## 2024-08-24 MED ORDER — OXYCODONE HCL 5 MG PO TABS
5.0000 mg | ORAL_TABLET | Freq: Once | ORAL | Status: AC
  Administered 2024-08-24: 5 mg via ORAL
  Filled 2024-08-24: qty 1

## 2024-08-24 NOTE — Plan of Care (Signed)
  Problem: Education: Goal: Knowledge of General Education information will improve Description: Including pain rating scale, medication(s)/side effects and non-pharmacologic comfort measures Outcome: Completed/Met   Problem: Health Behavior/Discharge Planning: Goal: Ability to manage health-related needs will improve Outcome: Completed/Met   Problem: Clinical Measurements: Goal: Ability to maintain clinical measurements within normal limits will improve Outcome: Completed/Met Goal: Will remain free from infection Outcome: Completed/Met Goal: Diagnostic test results will improve Outcome: Completed/Met Goal: Respiratory complications will improve Outcome: Completed/Met Goal: Cardiovascular complication will be avoided Outcome: Completed/Met   Problem: Activity: Goal: Risk for activity intolerance will decrease Outcome: Completed/Met   Problem: Nutrition: Goal: Adequate nutrition will be maintained Outcome: Completed/Met   Problem: Coping: Goal: Level of anxiety will decrease Outcome: Completed/Met   Problem: Elimination: Goal: Will not experience complications related to bowel motility Outcome: Completed/Met Goal: Will not experience complications related to urinary retention Outcome: Completed/Met   Problem: Pain Managment: Goal: General experience of comfort will improve and/or be controlled Outcome: Completed/Met   Problem: Safety: Goal: Ability to remain free from injury will improve Outcome: Completed/Met   Problem: Skin Integrity: Goal: Risk for impaired skin integrity will decrease Outcome: Completed/Met   Problem: Education: Goal: Knowledge of the prescribed therapeutic regimen will improve Outcome: Completed/Met Goal: Understanding of sexual limitations or changes related to disease process or condition will improve Outcome: Completed/Met Goal: Individualized Educational Video(s) Outcome: Completed/Met   Problem: Self-Concept: Goal: Communication of  feelings regarding changes in body function or appearance will improve Outcome: Completed/Met   Problem: Skin Integrity: Goal: Demonstration of wound healing without infection will improve Outcome: Completed/Met

## 2024-08-25 NOTE — Discharge Summary (Signed)
 Physician Discharge Summary  Patient ID: Deborah Petty MRN: [id number] DOB/AGE: 41-31-1982 41 y.o.  Admit date: 08/23/2024 Discharge date: 08/25/2024  Admission Diagnoses: Post endometrial ablation syndrome, admitted for hysterectomy  Discharge Diagnoses: same, admitted post op for pain control   Discharged Condition: good  Hospital Course: s/p da Vinci assisted Total laparoscopic hysterectomy bilateral salpingectomy, Uncomplicated surgery but admitted for pain control as I could not use any local anesthesia per Anesthesia MD due to pt receiving Exparel  for foot surgery the day before her surgery  She did well with IV Dilaudid  overnight.  Was voiding, ambulating and eating well and discharged after overnight pain management      Latest Ref Rng & Units 08/24/2024    8:07 AM 08/20/2024    9:19 AM   CMP  Glucose 70 - 99 mg/dL 83  62    BUN 6 - 20 mg/dL 12  5    Creatinine 9.55 - 1.00 mg/dL 9.11  9.07    Sodium 864 - 145 mmol/L 138  138    Potassium 3.5 - 5.1 mmol/L 4.3  3.0    Chloride 98 - 111 mmol/L 102  102    CO2 22 - 32 mmol/L 25  23    Calcium 8.9 - 10.3 mg/dL 8.8  9.0         Latest Ref Rng & Units 08/24/2024    8:07 AM 08/20/2024    9:19 AM   CBC  WBC 4.0 - 10.5 K/uL 14.6  11.8    Hemoglobin 12.0 - 15.0 g/dL 89.2  87.1    Hematocrit 36.0 - 46.0 % 33.4  39.8    Platelets 150 - 400 K/uL 281  310       Discharge Exam: Blood pressure 101/62, pulse 74, temperature 98.1 F (36.7 C), temperature source Oral, resp. rate 18, height 5' 6 (1.676 m), weight 89 kg, last menstrual period 07/30/2024, SpO2 96%, unknown if currently breastfeeding. Physical exam:  A&O x 3, no acute distress. Pleasant HEENT neg, no thyromegaly Lungs CTA bilat CV RRR, S1S2 normal Abdo soft, non tender, non acute.SABRA  NABS incision dry and intact  Extr no edema/ tenderness Pelvic deferred, no active bleeding   Disposition: Discharge disposition: 01-Home or Self Care       Discharge  Instructions     Call MD for:   Complete by: As directed    Vaginal bleeding that is more than few spots of blood   Call MD for:  difficulty breathing, headache or visual disturbances   Complete by: As directed    Call MD for:  extreme fatigue   Complete by: As directed    Call MD for:  hives   Complete by: As directed    Call MD for:  persistant dizziness or light-headedness   Complete by: As directed    Call MD for:  persistant nausea and vomiting   Complete by: As directed    Call MD for:  redness, tenderness, or signs of infection (pain, swelling, redness, odor or green/yellow discharge around incision site)   Complete by: As directed    Call MD for:  severe uncontrolled pain   Complete by: As directed    Call MD for:  temperature >100.4   Complete by: As directed    Diet - low sodium heart healthy   Complete by: As directed    Driving Restrictions   Complete by: As directed    2 weeks from hysterectomy stand-point   Driving Restrictions  Complete by: As directed    2-3 weeks as needed   Increase activity slowly   Complete by: As directed    Increase activity slowly   Complete by: As directed    Lifting restrictions   Complete by: As directed    No more than 5 pounds for  8 weeks   Lifting restrictions   Complete by: As directed    8 weeks   No dressing needed   Complete by: As directed    No wound care   Complete by: As directed    Sexual Activity Restrictions   Complete by: As directed    8 weeks   Sexual Activity Restrictions   Complete by: As directed    8 weeks      Allergies as of 08/24/2024       Reactions   Rizatriptan  Palpitations   Chest tightness, SOB        Medication List     TAKE these medications    acetaminophen  500 MG tablet Commonly known as: TYLENOL  Take 1 tablet (500 mg total) by mouth every 6 (six) hours as needed.   aspirin  EC 81 MG tablet Take 1 tablet (81 mg total) by mouth daily. Swallow whole.   botulinum toxin  Type A 200 units injection Commonly known as: BOTOX  Inject 155 units into head and neck muscles every 90 days.  Discard the remainder.   buPROPion 300 MG 24 hr tablet Commonly known as: WELLBUTRIN XL Take 300 mg by mouth daily.   busPIRone  30 MG tablet Commonly known as: BUSPAR  Take 30 mg by mouth 2 (two) times daily.   butalbital-acetaminophen -caffeine 50-325-40 MG tablet Commonly known as: FIORICET Take 1 tablet by mouth 2 (two) times daily as needed for headache or migraine.   cyanocobalamin 1000 MCG tablet Commonly known as: VITAMIN B12 Take 1,000 mcg by mouth 3 (three) times a week. M/W/F   diazepam 5 MG tablet Commonly known as: VALIUM Take 5 mg by mouth 3 (three) times daily as needed for anxiety.   diclofenac Sodium 1 % Gel Commonly known as: VOLTAREN Apply 4 g topically 4 (four) times daily as needed.   docusate sodium  100 MG capsule Commonly known as: Colace Take 1 capsule (100 mg total) by mouth 2 (two) times daily. While taking narcotic pain medicine.   flecainide  100 MG tablet Commonly known as: TAMBOCOR  Take 1 tablet (100 mg total) by mouth 2 (two) times daily.   ibuprofen  200 MG tablet Commonly known as: Advil  Take 3 tablets (600 mg total) by mouth every 6 (six) hours as needed.   MAGNESIUM GLUCONATE PO Take 480 mg by mouth at bedtime.   metoprolol  succinate 25 MG 24 hr tablet Commonly known as: Toprol  XL Take 0.5 tablets (12.5 mg total) by mouth daily.   Nurtec 75 MG Tbdp Generic drug: Rimegepant Sulfate Take 1 tablet (75 mg total) by mouth as needed (for migraine). Max dose 1 pill in 24 hours   ondansetron  4 MG disintegrating tablet Commonly known as: ZOFRAN -ODT Take 4 mg by mouth every 8 (eight) hours as needed for nausea or vomiting.   oxybutynin 5 MG tablet Commonly known as: DITROPAN Take 5 mg by mouth 2 (two) times daily.   oxyCODONE  5 MG immediate release tablet Commonly known as: Roxicodone  Take 1 tablet (5 mg total) by mouth every  6 (six) hours as needed for up to 5 days. What changed: Another medication with the same name was added. Make sure you understand how  and when to take each.   oxyCODONE  5 MG immediate release tablet Commonly known as: Roxicodone  Take 1 tablet (5 mg total) by mouth every 6 (six) hours as needed for severe pain (pain score 7-10). What changed: You were already taking a medication with the same name, and this prescription was added. Make sure you understand how and when to take each.   promethazine  25 MG tablet Commonly known as: PHENERGAN  Take 25 mg by mouth every 6 (six) hours as needed for nausea or vomiting.   senna 8.6 MG Tabs tablet Commonly known as: SENOKOT Take 2 tablets (17.2 mg total) by mouth 2 (two) times daily.   venlafaxine XR 75 MG 24 hr capsule Commonly known as: EFFEXOR-XR Take 3 capsules by mouth daily.   Vitamin D3 50 MCG (2000 UT) capsule Take 2,000 Units by mouth daily.               Discharge Care Instructions  (From admission, onward)           Start     Ordered   08/24/24 0000  No dressing needed        08/24/24 9078             Signed: Robbi JONELLE Render 08/25/2024, 9:03 AM

## 2024-08-26 LAB — SURGICAL PATHOLOGY

## 2024-08-26 NOTE — Telephone Encounter (Signed)
 See notes pt has been cleared and notes were sent to surgeon. Both surgeries have been completed.

## 2024-08-30 ENCOUNTER — Encounter (HOSPITAL_BASED_OUTPATIENT_CLINIC_OR_DEPARTMENT_OTHER): Payer: Self-pay | Admitting: Orthopedic Surgery

## 2024-09-07 ENCOUNTER — Encounter: Payer: Self-pay | Admitting: Cardiology

## 2024-09-18 ENCOUNTER — Encounter: Payer: Self-pay | Admitting: Adult Health

## 2024-09-18 NOTE — Telephone Encounter (Signed)
 Patient would like to do an at home sleep study before 10/02/24 due to meeting her deductible. Please advise if this is possible. Patient has a pending sleep order from [facility] that has not expired.

## 2024-09-20 ENCOUNTER — Ambulatory Visit: Admitting: Neurology

## 2024-09-20 DIAGNOSIS — G478 Other sleep disorders: Secondary | ICD-10-CM

## 2024-09-20 DIAGNOSIS — R519 Headache, unspecified: Secondary | ICD-10-CM

## 2024-09-20 DIAGNOSIS — G4733 Obstructive sleep apnea (adult) (pediatric): Secondary | ICD-10-CM | POA: Diagnosis not present

## 2024-09-20 DIAGNOSIS — Z9189 Other specified personal risk factors, not elsewhere classified: Secondary | ICD-10-CM

## 2024-09-20 DIAGNOSIS — S0101XS Laceration without foreign body of scalp, sequela: Secondary | ICD-10-CM

## 2024-09-20 DIAGNOSIS — E66811 Obesity, class 1: Secondary | ICD-10-CM

## 2024-09-20 DIAGNOSIS — R0683 Snoring: Secondary | ICD-10-CM

## 2024-09-23 ENCOUNTER — Encounter: Payer: Self-pay | Admitting: Adult Health

## 2024-10-02 ENCOUNTER — Encounter: Payer: Self-pay | Admitting: Cardiology

## 2024-10-02 NOTE — Progress Notes (Signed)
 See procedure note.

## 2024-10-07 ENCOUNTER — Encounter: Payer: Self-pay | Admitting: Neurology

## 2024-10-07 ENCOUNTER — Encounter: Payer: Self-pay | Admitting: Adult Health

## 2024-10-07 ENCOUNTER — Ambulatory Visit: Payer: Self-pay | Admitting: Neurology

## 2024-10-07 NOTE — Procedures (Signed)
"  ° °  [facility]  HOME SLEEP TEST (SANSA) REPORT (Mail-Out Device):   STUDY DATE: 09/24/24  DOB: 06-22-1981  MRN: [id number]  ORDERING CLINICIAN: True Mar, MD, PhD   REFERRING CLINICIAN: Jefferey Fitch, MD   CLINICAL INFORMATION/HISTORY (obtained from visit note dated 01/03/2024): 44 year old female with an underlying medical history of low back pain, neck pain, depression, anxiety, dizzy spells, DVT, hyperhidrosis, hair loss, migraine headaches and borderline obesity, who reports recurrent headaches.  She does not wake up rested. She snores occasionally. She has woken up with headaches.    BMI (at the time of sleep clinic visit and/or test date): 32 kg/m  FINDINGS:   Study Protocol:    The SANSA single-point-of-skin-contact chest-worn sensor - an FDA cleared and DOT approved type 4 home sleep test device - measures eight physiological channels,  including blood oxygen saturation (measured via PPG [photoplethysmography]), EKG-derived heart rate, respiratory effort, chest movement (measured via accelerometer), snoring, body position, and actigraphy. The device is designed to be worn for up to 10 hours per study.   Sleep Summary:   Total Recording Time (hours, min): 9 hours, 53 min  Total Effective Sleep Time (hours, min):  8 hours, 23 min  Sleep Efficiency (%):    85%   Respiratory Indices:   Calculated sAHI (per hour):  2/hour        Calculated sAHIc (central AHI per hour):  0.4/hour  Oxygen Saturation Statistics:    Oxygen Saturation (%) Mean: 96.5%   Minimum oxygen saturation (%):                 86.7%   O2 Saturation Range (%): 86.7-100%   Time below or at 88% saturation: <1 min   Pulse Rate Statistics:   Pulse Mean (bpm):    72/min    Pulse Range (62-91/min)   Snoring: Mild, intermittent  IMPRESSION/DIAGNOSES:   Primary snoring   RECOMMENDATIONS:   This home sleep test does not demonstrate any significant obstructive or central  sleep disordered breathing with a total AHI of less than 5/hour; her total AHI was 2/hour, O2 nadir of 86.7%.  Snoring was detected, intermittently, in the mild range. Treatment with a positive airway pressure device such as AutoPap or CPAP is not indicated based on this test. Snoring may improve with avoidance of the supine sleep position and weight loss (where clinically appropriate).   For disturbing snoring, an oral appliance through dentistry or orthodontics can be considered.  Other causes of the patient's symptoms, including circadian rhythm disturbances, an underlying mood disorder, medication effect and/or an underlying medical problem cannot be ruled out based on this test. Clinical correlation is recommended.  The patient should be cautioned not to drive, work at heights, or operate dangerous or heavy equipment when tired or sleepy. Review and reiteration of good sleep hygiene measures should be pursued with any patient. The patient will be advised to follow up with her referring provider, who will be notified of the test results.   I certify that I have reviewed the raw data recording prior to the issuance of this report in accordance with the standards of the American Academy of Sleep Medicine (AASM).    INTERPRETING PHYSICIAN:   True Mar, MD, PhD Medical Director, [facility]) Diplomat, ABPN (Neurology and Sleep)   [facility] [address] [place], [place] [address] [phone]             "

## 2024-10-07 NOTE — Telephone Encounter (Signed)
 Duplicate, other message sent to [facility] NP.

## 2024-10-30 ENCOUNTER — Telehealth: Payer: Self-pay | Admitting: Adult Health

## 2024-10-30 NOTE — Telephone Encounter (Signed)
 Elia @ [facility] has called to schedule delivery of Botox  address and hours verified, delivery date 11-13-24 SDV, 200 units, quantity of 1

## 2024-11-21 ENCOUNTER — Ambulatory Visit: Admitting: Adult Health

## 2024-11-22 ENCOUNTER — Ambulatory Visit: Admitting: Adult Health
